# Patient Record
Sex: Male | Born: 1940 | Race: White | Hispanic: No
Health system: Southern US, Community
[De-identification: ages and names within clinical notes are randomized; demographics above are authoritative.]

## PROBLEM LIST (undated history)

## (undated) DIAGNOSIS — I10 Essential (primary) hypertension: Secondary | ICD-10-CM

## (undated) DIAGNOSIS — F03918 Unspecified dementia, unspecified severity, with other behavioral disturbance: Secondary | ICD-10-CM

## (undated) DIAGNOSIS — F0391 Unspecified dementia with behavioral disturbance: Secondary | ICD-10-CM

## (undated) DIAGNOSIS — I2510 Atherosclerotic heart disease of native coronary artery without angina pectoris: Secondary | ICD-10-CM

---

## 2020-11-21 HISTORY — PX: LAPAROSCOPIC INTERNAL HERNIA REPAIR: SHX6502

## 2021-01-15 ENCOUNTER — Other Ambulatory Visit: Payer: Self-pay

## 2021-01-15 ENCOUNTER — Emergency Department (HOSPITAL_COMMUNITY): Payer: Medicare Other

## 2021-01-15 ENCOUNTER — Emergency Department (HOSPITAL_COMMUNITY)
Admission: EM | Admit: 2021-01-15 | Discharge: 2021-01-15 | Disposition: A | Discharge status: Home / Self Care | Payer: Medicare Other | Attending: Emergency Medicine | Admitting: Emergency Medicine

## 2021-01-15 DIAGNOSIS — Z23 Encounter for immunization: Secondary | ICD-10-CM | POA: Insufficient documentation

## 2021-01-15 DIAGNOSIS — S0993XA Unspecified injury of face, initial encounter: Secondary | ICD-10-CM | POA: Diagnosis present

## 2021-01-15 DIAGNOSIS — F0390 Unspecified dementia without behavioral disturbance: Secondary | ICD-10-CM | POA: Diagnosis not present

## 2021-01-15 DIAGNOSIS — W19XXXA Unspecified fall, initial encounter: Secondary | ICD-10-CM

## 2021-01-15 DIAGNOSIS — W06XXXA Fall from bed, initial encounter: Secondary | ICD-10-CM | POA: Diagnosis not present

## 2021-01-15 DIAGNOSIS — S0181XA Laceration without foreign body of other part of head, initial encounter: Secondary | ICD-10-CM | POA: Insufficient documentation

## 2021-01-15 MED ORDER — HYDROGEN PEROXIDE 3 % EX SOLN
CUTANEOUS | Status: AC
  Filled 2021-01-15: qty 473

## 2021-01-15 MED ORDER — TETANUS-DIPHTH-ACELL PERTUSSIS 5-2.5-18.5 LF-MCG/0.5 IM SUSY
0.5000 mL | PREFILLED_SYRINGE | Freq: Once | INTRAMUSCULAR | Status: AC
  Administered 2021-01-15: 0.5 mL via INTRAMUSCULAR
  Filled 2021-01-15: qty 0.5

## 2021-01-15 NOTE — ED Notes (Signed)
[facility] has been contacted regarding patient transport.  

## 2021-01-15 NOTE — ED Triage Notes (Signed)
Patient BIB EMS from [facility] due to fall from standing position, patient did not lose consciousness and is not on blood thinners. Patient has laceration over right eye. Patient is AxOx1 at baseline. Patient has previous right rotator cuff injury to right shoulder that is unrelated to this fall.

## 2021-01-15 NOTE — Discharge Instructions (Addendum)
You were seen today after a fall.  Your CT scan is reassuring.  Your laceration was repaired with glue.  Allow this to fall off on its own.

## 2021-01-15 NOTE — ED Provider Notes (Signed)
[facility]-EMERGENCY DEPT Provider Note   CSN: [id number] Arrival date & time: 01/15/21  0410     History Chief Complaint  Patient presents with  . Fall    Nihaal Friesen is a 80 y.o. male.  HPI     This is a 80 year old male with reported history of dementia who presents following a fall.  Patient does not provide any history.  History provided by EMS.  History of dementia with oriented only to himself.  Was standing at the foot of his bed when he fell.  This was a witnessed fall.  He reportedly did not lose consciousness.  He is not on any blood thinners.  Level 5 caveat for dementia  No past medical history on file.  There are no problems to display for this patient. Unknown PMH  No family history on file.     Home Medications Prior to Admission medications   Not on File    Allergies    Patient has no allergy information on record.  Review of Systems   Review of Systems  Unable to perform ROS: Dementia    Physical Exam Updated Vital Signs BP (!) 151/88   Pulse (!) 51   Temp 98.2 F (36.8 C) (Oral)   Resp 15   SpO2 100%   Physical Exam Vitals and nursing note reviewed.  Constitutional:      Appearance: He is well-developed and well-nourished.     Comments: ABCs intact, elderly, chronically ill-appearing  HENT:     Head: Atraumatic.     Comments: 2 cm laceration right forehead, no active bleeding    Nose: Nose normal.     Mouth/Throat:     Mouth: Mucous membranes are dry.  Eyes:     Pupils: Pupils are equal, round, and reactive to light.  Cardiovascular:     Rate and Rhythm: Normal rate and regular rhythm.     Heart sounds: Normal heart sounds.  Pulmonary:     Effort: Pulmonary effort is normal. No respiratory distress.     Breath sounds: Normal breath sounds. No wheezing.  Abdominal:     General: Bowel sounds are normal.     Palpations: Abdomen is soft.     Tenderness: There is no abdominal tenderness. There is no  rebound.  Musculoskeletal:        General: No edema.     Cervical back: Neck supple.     Comments: Normal range of motion of the bilateral hips and knees, no obvious deformities, no pain noted with range of motion  Lymphadenopathy:     Cervical: No cervical adenopathy.  Skin:    General: Skin is warm and dry.  Neurological:     Mental Status: He is alert.     Comments: Only oriented to self, will not answer directed questions, appears to move all 4 extremities but does not follow commands consistently  Psychiatric:        Mood and Affect: Mood and affect normal.     Comments: Unable to assess     ED Results / Procedures / Treatments   Labs (all labs ordered are listed, but only abnormal results are displayed) Labs Reviewed - No data to display  EKG None  Radiology CT Head Wo Contrast  Result Date: 01/15/2021 CLINICAL DATA:  Fall from standing with eye laceration EXAM: CT HEAD WITHOUT CONTRAST CT CERVICAL SPINE WITHOUT CONTRAST TECHNIQUE: Multidetector CT imaging of the head and cervical spine was performed following the standard protocol  without intravenous contrast. Multiplanar CT image reconstructions of the cervical spine were also generated. COMPARISON:  None. FINDINGS: CT HEAD FINDINGS Brain: No evidence of swelling, acute infarction, hemorrhage, hydrocephalus, extra-axial collection or mass lesion/mass effect. Chronic small vessel ischemia which is confluent in the deep white matter. 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 Vascular: No hyperdense vessel or unexpected calcification. Skull: No acute finding. Benign sclerosis at the sphenoid body from fibro-osseous lesion or arrested sinus aeration. Sinuses/Orbits: No evidence of injury. Bilateral cataract resection. CT CERVICAL SPINE FINDINGS Alignment: Normal. Skull base and vertebrae: No acute fracture. No primary bone lesion or focal pathologic process. Soft tissues and spinal canal: No prevertebral fluid or  swelling. No visible canal hematoma. Disc levels: Multilevel degenerative disc narrowing and facet spurring. Upper chest: Negative IMPRESSION: 1. No evidence of acute intracranial or cervical spine injury. 2. Small remote right parietal infarct. Electronically Signed   By: Marnee Spring M.D.   On: 01/15/2021 07:21   CT Cervical Spine Wo Contrast  Result Date: 01/15/2021 CLINICAL DATA:  Fall from standing with eye laceration EXAM: CT HEAD WITHOUT CONTRAST CT CERVICAL SPINE WITHOUT CONTRAST TECHNIQUE: Multidetector CT imaging of the head and cervical spine was performed following the standard protocol without intravenous contrast. Multiplanar CT image reconstructions of the cervical spine were also generated. COMPARISON:  None. FINDINGS: CT HEAD FINDINGS Brain: No evidence of swelling, acute infarction, hemorrhage, hydrocephalus, extra-axial collection or mass lesion/mass effect. Chronic small vessel ischemia which is confluent in the deep white matter. 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 Vascular: No hyperdense vessel or unexpected calcification. Skull: No acute finding. Benign sclerosis at the sphenoid body from fibro-osseous lesion or arrested sinus aeration. Sinuses/Orbits: No evidence of injury. Bilateral cataract resection. CT CERVICAL SPINE FINDINGS Alignment: Normal. Skull base and vertebrae: No acute fracture. No primary bone lesion or focal pathologic process. Soft tissues and spinal canal: No prevertebral fluid or swelling. No visible canal hematoma. Disc levels: Multilevel degenerative disc narrowing and facet spurring. Upper chest: Negative IMPRESSION: 1. No evidence of acute intracranial or cervical spine injury. 2. Small remote right parietal infarct. Electronically Signed   By: Marnee Spring M.D.   On: 01/15/2021 07:21    Procedures .Marland KitchenLaceration Repair  Date/Time: 01/15/2021 7:32 AM Performed by: Shon Baton, MD Authorized by: Shon Baton, MD   Consent:     Consent obtained:  Emergent situation Anesthesia:    Anesthesia method:  None Laceration details:    Location:  Face   Face location:  R eyebrow   Length (cm):  3   Depth (mm):  5 Exploration:    Limited defect created (wound extended): no     Imaging obtained: x-ray     Imaging outcome: foreign body not noted     Wound exploration: wound explored through full range of motion     Contaminated: no   Treatment:    Area cleansed with:  Saline   Amount of cleaning:  Standard   Irrigation solution:  Sterile saline   Irrigation method:  Syringe   Visualized foreign bodies/material removed: no     Debridement:  None   Undermining:  None   Scar revision: no   Skin repair:    Repair method:  Tissue adhesive and Steri-Strips   Number of Steri-Strips:  2 Approximation:    Approximation:  Close Repair type:    Repair type:  Simple Post-procedure details:    Dressing:  Open (no dressing)   Procedure completion:  Tolerated     Medications Ordered in ED Medications  Tdap (BOOSTRIX) injection 0.5 mL (0.5 mLs Intramuscular Given 01/15/21 0501)  hydrogen peroxide 3 % external solution (  Given 01/15/21 0615)    ED Course  I have reviewed the triage vital signs and the nursing notes.  Pertinent labs & imaging results that were available during my care of the patient were reviewed by me and considered in my medical decision making (see chart for details).    MDM Rules/Calculators/A&P                          Patient presents following a fall.  It was witnessed and reported to be mechanical.  He appears to be at his baseline from a dementia standpoint.  He has a laceration to the right side of the forehead just at the eyebrow.  This was repaired with glue as patient was intermittently agitated and I did not feel like he would tolerate sutures.  Wound was approximated with Steri-Strips and glue was applied.  CT head and neck obtained given patient's age and does not show any evidence of  intracranial bleed or cervical fracture.  Patient will be discharged back to his facility.  No other obvious injuries.  After history, exam, and medical workup I feel the patient has been appropriately medically screened and is safe for discharge home. Pertinent diagnoses were discussed with the patient. Patient was given return precautions.  Final Clinical Impression(s) / ED Diagnoses Final diagnoses:  Fall, initial encounter  Facial laceration, initial encounter    Rx / DC Orders ED Discharge Orders    None       Horton, Mayer Masker, MD 01/15/21 [id number]

## 2021-02-02 ENCOUNTER — Emergency Department (HOSPITAL_COMMUNITY): Payer: Medicare Other

## 2021-02-02 ENCOUNTER — Inpatient Hospital Stay (HOSPITAL_COMMUNITY)
Admission: EM | Admit: 2021-02-02 | Discharge: 2021-02-12 | DRG: 640 | Disposition: E | Payer: Medicare Other | Source: Skilled Nursing Facility | Attending: Internal Medicine | Admitting: Internal Medicine

## 2021-02-02 ENCOUNTER — Encounter (HOSPITAL_COMMUNITY): Payer: Self-pay | Admitting: Internal Medicine

## 2021-02-02 ENCOUNTER — Other Ambulatory Visit: Payer: Self-pay

## 2021-02-02 DIAGNOSIS — Z20822 Contact with and (suspected) exposure to covid-19: Secondary | ICD-10-CM | POA: Diagnosis present

## 2021-02-02 DIAGNOSIS — E870 Hyperosmolality and hypernatremia: Secondary | ICD-10-CM | POA: Diagnosis not present

## 2021-02-02 DIAGNOSIS — Z9181 History of falling: Secondary | ICD-10-CM

## 2021-02-02 DIAGNOSIS — E872 Acidosis: Secondary | ICD-10-CM | POA: Diagnosis present

## 2021-02-02 DIAGNOSIS — F0391 Unspecified dementia with behavioral disturbance: Secondary | ICD-10-CM | POA: Diagnosis present

## 2021-02-02 DIAGNOSIS — Z515 Encounter for palliative care: Secondary | ICD-10-CM

## 2021-02-02 DIAGNOSIS — F03918 Unspecified dementia, unspecified severity, with other behavioral disturbance: Secondary | ICD-10-CM | POA: Diagnosis present

## 2021-02-02 DIAGNOSIS — Z66 Do not resuscitate: Secondary | ICD-10-CM | POA: Diagnosis present

## 2021-02-02 DIAGNOSIS — F0281 Dementia in other diseases classified elsewhere with behavioral disturbance: Secondary | ICD-10-CM | POA: Diagnosis present

## 2021-02-02 DIAGNOSIS — I10 Essential (primary) hypertension: Secondary | ICD-10-CM | POA: Diagnosis present

## 2021-02-02 DIAGNOSIS — R54 Age-related physical debility: Secondary | ICD-10-CM | POA: Diagnosis present

## 2021-02-02 DIAGNOSIS — Z8052 Family history of malignant neoplasm of bladder: Secondary | ICD-10-CM

## 2021-02-02 DIAGNOSIS — G9341 Metabolic encephalopathy: Secondary | ICD-10-CM | POA: Diagnosis present

## 2021-02-02 DIAGNOSIS — W19XXXA Unspecified fall, initial encounter: Secondary | ICD-10-CM

## 2021-02-02 DIAGNOSIS — G309 Alzheimer's disease, unspecified: Secondary | ICD-10-CM | POA: Diagnosis present

## 2021-02-02 DIAGNOSIS — R532 Functional quadriplegia: Secondary | ICD-10-CM | POA: Diagnosis present

## 2021-02-02 DIAGNOSIS — Z8619 Personal history of other infectious and parasitic diseases: Secondary | ICD-10-CM

## 2021-02-02 DIAGNOSIS — I2510 Atherosclerotic heart disease of native coronary artery without angina pectoris: Secondary | ICD-10-CM | POA: Diagnosis present

## 2021-02-02 DIAGNOSIS — E860 Dehydration: Secondary | ICD-10-CM | POA: Diagnosis present

## 2021-02-02 DIAGNOSIS — Z79899 Other long term (current) drug therapy: Secondary | ICD-10-CM

## 2021-02-02 HISTORY — DX: Unspecified dementia, unspecified severity, with other behavioral disturbance: F03.918

## 2021-02-02 HISTORY — DX: Essential (primary) hypertension: I10

## 2021-02-02 HISTORY — DX: Unspecified dementia with behavioral disturbance: F03.91

## 2021-02-02 HISTORY — DX: Atherosclerotic heart disease of native coronary artery without angina pectoris: I25.10

## 2021-02-02 LAB — COMPREHENSIVE METABOLIC PANEL
ALT: 20 U/L (ref 0–44)
AST: 29 U/L (ref 15–41)
Albumin: 3.7 g/dL (ref 3.5–5.0)
Alkaline Phosphatase: 124 U/L (ref 38–126)
Anion gap: 9 (ref 5–15)
BUN: 47 mg/dL — ABNORMAL HIGH (ref 8–23)
CO2: 30 mmol/L (ref 22–32)
Calcium: 10.6 mg/dL — ABNORMAL HIGH (ref 8.9–10.3)
Chloride: 126 mmol/L — ABNORMAL HIGH (ref 98–111)
Creatinine, Ser: 1.23 mg/dL (ref 0.61–1.24)
GFR, Estimated: >60 mL/min (ref >60)
Glucose, Bld: 118 mg/dL — ABNORMAL HIGH (ref 70–99)
Potassium: 3.8 mmol/L (ref 3.5–5.1)
Sodium: 165 mmol/L (ref 135–145)
Total Bilirubin: 1.4 mg/dL — ABNORMAL HIGH (ref 0.3–1.2)
Total Protein: 6.9 g/dL (ref 6.5–8.1)

## 2021-02-02 LAB — URINALYSIS, ROUTINE W REFLEX MICROSCOPIC
Bilirubin Urine: NEGATIVE
Glucose, UA: NEGATIVE mg/dL
Ketones, ur: NEGATIVE mg/dL
Nitrite: NEGATIVE
Protein, ur: 100 mg/dL — AB
RBC / HPF: >50 RBC/hpf — ABNORMAL HIGH (ref 0–5)
Specific Gravity, Urine: 1.021 (ref 1.005–1.030)
WBC, UA: >50 WBC/hpf — ABNORMAL HIGH (ref 0–5)
pH: 5 (ref 5.0–8.0)

## 2021-02-02 LAB — CBC WITH DIFFERENTIAL/PLATELET
Abs Immature Granulocytes: 0 10*3/uL (ref 0.00–0.07)
Basophils Absolute: 0 10*3/uL (ref 0.0–0.1)
Basophils Relative: 1 %
Eosinophils Absolute: 0.1 10*3/uL (ref 0.0–0.5)
Eosinophils Relative: 1 %
HCT: 39.8 % (ref 39.0–52.0)
Hemoglobin: 12.2 g/dL — ABNORMAL LOW (ref 13.0–17.0)
Immature Granulocytes: 0 %
Lymphocytes Relative: 41 %
Lymphs Abs: 2.2 10*3/uL (ref 0.7–4.0)
MCH: 32.5 pg (ref 26.0–34.0)
MCHC: 30.7 g/dL (ref 30.0–36.0)
MCV: 106.1 fL — ABNORMAL HIGH (ref 80.0–100.0)
Monocytes Absolute: 0.5 10*3/uL (ref 0.1–1.0)
Monocytes Relative: 10 %
Neutro Abs: 2.5 10*3/uL (ref 1.7–7.7)
Neutrophils Relative %: 47 %
Platelets: 211 10*3/uL (ref 150–400)
RBC: 3.75 MIL/uL — ABNORMAL LOW (ref 4.22–5.81)
RDW: 16.5 % — ABNORMAL HIGH (ref 11.5–15.5)
WBC: 5.4 10*3/uL (ref 4.0–10.5)
nRBC: 0 % (ref 0.0–0.2)

## 2021-02-02 LAB — LACTIC ACID, PLASMA: Lactic Acid, Venous: 2.9 mmol/L (ref 0.5–1.9)

## 2021-02-02 LAB — PROTIME-INR
INR: 1.3 — ABNORMAL HIGH (ref 0.8–1.2)
Prothrombin Time: 15.4 seconds — ABNORMAL HIGH (ref 11.4–15.2)

## 2021-02-02 LAB — APTT: aPTT: 36 seconds (ref 24–36)

## 2021-02-02 LAB — VALPROIC ACID LEVEL: Valproic Acid Lvl: <10 ug/mL — ABNORMAL LOW (ref 50.0–100.0)

## 2021-02-02 MED ORDER — GLYCOPYRROLATE 1 MG PO TABS
1.0000 mg | ORAL_TABLET | ORAL | Status: DC | PRN
  Filled 2021-02-02: qty 1

## 2021-02-02 MED ORDER — ACETAMINOPHEN 650 MG RE SUPP: 650.0000 mg | Freq: Four times a day (QID) | RECTAL | Status: DC | PRN

## 2021-02-02 MED ORDER — ONDANSETRON 4 MG PO TBDP: 4.0000 mg | ORAL_TABLET | Freq: Four times a day (QID) | ORAL | Status: DC | PRN

## 2021-02-02 MED ORDER — HALOPERIDOL LACTATE 5 MG/ML IJ SOLN
2.0000 mg | Freq: Once | INTRAMUSCULAR | Status: AC
  Administered 2021-02-02: 2 mg via INTRAMUSCULAR
  Filled 2021-02-02: qty 1

## 2021-02-02 MED ORDER — LORAZEPAM 2 MG/ML PO CONC: 1.0000 mg | ORAL | Status: DC | PRN

## 2021-02-02 MED ORDER — ACETAMINOPHEN 325 MG PO TABS: 650.0000 mg | ORAL_TABLET | Freq: Four times a day (QID) | ORAL | Status: DC | PRN

## 2021-02-02 MED ORDER — GLYCOPYRROLATE 0.2 MG/ML IJ SOLN
0.2000 mg | INTRAMUSCULAR | Status: DC | PRN
  Administered 2021-02-04 – 2021-02-05 (×4): 0.2 mg via INTRAVENOUS
  Filled 2021-02-02 (×2): qty 1

## 2021-02-02 MED ORDER — LORAZEPAM 2 MG/ML IJ SOLN
1.0000 mg | INTRAMUSCULAR | Status: DC | PRN
  Administered 2021-02-03 – 2021-02-05 (×8): 1 mg via INTRAVENOUS
  Filled 2021-02-02 (×8): qty 1

## 2021-02-02 MED ORDER — HYDROMORPHONE HCL 1 MG/ML IJ SOLN
0.5000 mg | INTRAMUSCULAR | Status: DC | PRN
  Administered 2021-02-03 – 2021-02-04 (×5): 0.5 mg via INTRAVENOUS
  Filled 2021-02-02: qty 0.5
  Filled 2021-02-02: qty 1
  Filled 2021-02-02 (×3): qty 0.5

## 2021-02-02 MED ORDER — HALOPERIDOL LACTATE 5 MG/ML IJ SOLN: 0.5000 mg | INTRAMUSCULAR | Status: DC | PRN

## 2021-02-02 MED ORDER — LORAZEPAM 1 MG PO TABS: 1.0000 mg | ORAL_TABLET | ORAL | Status: DC | PRN

## 2021-02-02 MED ORDER — ONDANSETRON HCL 4 MG/2ML IJ SOLN: 4.0000 mg | Freq: Four times a day (QID) | INTRAMUSCULAR | Status: DC | PRN

## 2021-02-02 MED ORDER — MORPHINE SULFATE (CONCENTRATE) 10 MG/0.5ML PO SOLN: 5.0000 mg | ORAL | Status: DC | PRN

## 2021-02-02 MED ORDER — HALOPERIDOL 0.5 MG PO TABS: 0.5000 mg | ORAL_TABLET | ORAL | Status: DC | PRN

## 2021-02-02 MED ORDER — GLYCOPYRROLATE 0.2 MG/ML IJ SOLN
0.2000 mg | INTRAMUSCULAR | Status: DC | PRN
  Filled 2021-02-02 (×2): qty 1

## 2021-02-02 MED ORDER — POLYVINYL ALCOHOL 1.4 % OP SOLN
1.0000 [drp] | Freq: Four times a day (QID) | OPHTHALMIC | Status: DC | PRN
  Filled 2021-02-02: qty 15

## 2021-02-02 MED ORDER — BIOTENE DRY MOUTH MT LIQD: 15.0000 mL | OROMUCOSAL | Status: DC | PRN

## 2021-02-02 MED ORDER — HALOPERIDOL LACTATE 2 MG/ML PO CONC
0.5000 mg | ORAL | Status: DC | PRN
  Filled 2021-02-02: qty 0.3

## 2021-02-02 MED ORDER — SODIUM CHLORIDE 0.9 % IV BOLUS (SEPSIS)
500.0000 mL | Freq: Once | INTRAVENOUS | Status: AC
  Administered 2021-02-02: 500 mL via INTRAVENOUS

## 2021-02-02 MED ORDER — SODIUM CHLORIDE 0.9 % IV SOLN
1.0000 g | Freq: Once | INTRAVENOUS | Status: AC
  Administered 2021-02-02: 1 g via INTRAVENOUS
  Filled 2021-02-02: qty 10

## 2021-02-02 NOTE — ED Provider Notes (Signed)
[facility]-EMERGENCY DEPT Provider Note   CSN: [id number] Arrival date & time: 13-Feb-2021  1324     History Chief Complaint  Patient presents with  . Fall    Johnathan Combs is a 80 y.o. male.  The history is provided by the EMS personnel and medical records.  Fall   Johnathan Combs is a 80 y.o. male who presents to the Emergency Department complaining of fall.  Level V caveat due to dementia. History is provided by EMS. He is a resident at [facility] and nonverbal and nonambulatory at baseline per report. He was found down in a hallway.   Additional history available from the patient's son-in-law after patient's initial assessment. Son-in-law states that he was admitted to do probably for inguinal hernia surgery several months ago at [facility]. He was able to walk in prior to that surgery. Following the surgery he had difficulty awaking from anesthesia and has suffered from progressive dementia since then.    Past Medical History:  Diagnosis Date  . CAD (coronary artery disease)   . Dementia with aggressive behavior (HCC)   . Hypertension    alzheimers  Patient Active Problem List   Diagnosis Date Noted  . Hypernatremia 02-13-2021  . Dehydration 2021/02/13  . Hypercalcemia 02/13/2021  . Hypertension   . Dementia with aggressive behavior (HCC)   . CAD (coronary artery disease)          Family History  Problem Relation Age of Onset  . Bladder Cancer Father        Home Medications Prior to Admission medications   Medication Sig Start Date End Date Taking? Authorizing Provider  acetaminophen (TYLENOL) 325 MG tablet Take 650 mg by mouth every 6 (six) hours as needed for mild pain.   Yes [provider]  amLODipine (NORVASC) 5 MG tablet Take 5 mg by mouth daily.   Yes [provider]  carboxymethylcellulose (REFRESH PLUS) 0.5 % SOLN Place 1 drop into both eyes 3 (three) times daily as needed (dry eyes).    Yes [provider]  cyanocobalamin 1000 MCG tablet Take 1,000 mcg by mouth daily.   Yes [provider]  Lidocaine 4 % PTCH Apply 1 patch topically daily at 6 (six) AM. To shoulder   Yes [provider]  melatonin 3 MG TABS tablet Take 3 mg by mouth at bedtime.   Yes [provider]  melatonin 5 MG TABS Take 5 mg by mouth daily. 8 am   Yes [provider]  metoprolol succinate (TOPROL-XL) 25 MG 24 hr tablet Take 25 mg by mouth daily.   Yes [provider]  mupirocin ointment (BACTROBAN) 2 % Apply 1 application topically 2 (two) times daily. Apply to left inguinal incision   Yes [provider]  niacin 500 MG tablet Take 500 mg by mouth at bedtime.   Yes [provider]  Nutritional Supplements (NUTRITIONAL SHAKE HIGH PROTEIN) LIQD Take 1 Container by mouth in the morning and at bedtime.   Yes [provider]  pantoprazole (PROTONIX) 40 MG tablet Take 40 mg by mouth daily.   Yes [provider]  polyethylene glycol (MIRALAX / GLYCOLAX) 17 g packet Take 17 g by mouth 2 (two) times daily.   Yes [provider]  potassium chloride SA (KLOR-CON) 20 MEQ tablet Take 20 mEq by mouth daily.   Yes [provider]  prednisoLONE acetate (PRED FORTE) 1 % ophthalmic suspension Place 1 drop into the  right eye daily.   Yes [provider]  sucralfate (CARAFATE) 1 g tablet Take 1 g by mouth 2 (two) times daily.   Yes [provider]  traMADol (ULTRAM) 50 MG tablet Take 50 mg by mouth every 8 (eight) hours as needed for moderate pain.   Yes [provider]  valproic acid (DEPAKENE) 250 MG/5ML solution Take 250 mg by mouth 2 (two) times daily.   Yes [provider]  Vitamin E 450 MG (1000 UT) CAPS Take 450 mg by mouth daily.   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  Constitutional:      Appearance: He is well-developed and well-nourished.  HENT:     Head: Normocephalic and atraumatic.     Comments: Dry mucous membranes Cardiovascular:     Rate and Rhythm: Normal rate and regular rhythm.     Heart sounds: No murmur heard.   Pulmonary:     Effort: Pulmonary effort is normal. No respiratory distress.     Breath sounds: Normal breath sounds.  Abdominal:     Palpations: Abdomen is soft.     Tenderness: There is no abdominal tenderness. There is no guarding or rebound.     Comments: There is a left inguinal surgical wound that is healing. The lateral portion of this wound is draining yellow material. There is no appreciable tenderness to palpation in this region. There is no local erythema or palpable fluid collections. No scrotal edema or tenderness  Musculoskeletal:        General: No tenderness or edema.     Comments: 2+ DP pulses bilaterally. On palpation and range of motion of bilateral hips there is no clear tenderness or grimace on exam. There is pain with passive range of motion to the right shoulder, right elbow. No palpable deformity.  Skin:    General: Skin is warm and dry.  Neurological:     Mental Status: He is alert.     Comments: Moves all extremities spontaneously, symmetrically but very weakly. Does not follow commands. No significant intelligible speech.  Psychiatric:        Mood and Affect: Mood and affect normal.     Comments: Unable to assess     ED Results / Procedures / Treatments   Labs (all labs ordered are listed, but only abnormal results are displayed) Labs Reviewed  LACTIC ACID, PLASMA - Abnormal; Notable for the following components:      Result Value   Lactic Acid, Venous 2.9 (*)    All other  components within normal limits  COMPREHENSIVE METABOLIC PANEL - Abnormal; Notable for the following components:   Sodium 165 (*)    Chloride 126 (*)    Glucose, Bld 118 (*)    BUN 47 (*)    Calcium 10.6 (*)    Total Bilirubin 1.4 (*)    All other components within normal limits  CBC WITH DIFFERENTIAL/PLATELET - Abnormal; Notable for the following components:   RBC 3.75 (*)    Hemoglobin 12.2 (*)    MCV 106.1 (*)    RDW 16.5 (*)  All other components within normal limits  PROTIME-INR - Abnormal; Notable for the following components:   Prothrombin Time 15.4 (*)    INR 1.3 (*)    All other components within normal limits  URINALYSIS, ROUTINE W REFLEX MICROSCOPIC - Abnormal; Notable for the following components:   APPearance TURBID (*)    Hgb urine dipstick SMALL (*)    Protein, ur 100 (*)    Leukocytes,Ua MODERATE (*)    RBC / HPF >50 (*)    WBC, UA >50 (*)    Bacteria, UA RARE (*)    Non Squamous Epithelial 0-5 (*)    All other components within normal limits  VALPROIC ACID LEVEL - Abnormal; Notable for the following components:   Valproic Acid Lvl <10 (*)    All other components within normal limits  CULTURE, BLOOD (SINGLE)  URINE CULTURE  SARS CORONAVIRUS 2 (TAT 6-24 HRS)  APTT    EKG EKG Interpretation  Date/Time:  Saturday February 02 2021 14:01:26 EST Ventricular Rate:  64 PR Interval:  232 QRS Duration: 80 QT Interval:  418 QTC Calculation: 431 R Axis:     Text Interpretation: Sinus rhythm with 1st degree A-V block Nonspecific T wave abnormality Abnormal ECG No previous tracing Confirmed by Tilden Fossa [phone]) on 01/16/2021 2:10:13 PM   Radiology DG Chest 1 View  Result Date: 01/24/2021 CLINICAL DATA:  Pain status post fall EXAM: CHEST  1 VIEW COMPARISON:  None. 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 The lung volumes are low. There is no pneumothorax. No definite acute displaced fracture. There are end-stage  degenerative changes of the right glenohumeral joint. Atelectasis is noted at the lung bases. Aortic calcifications are noted. The heart size is unremarkable. IMPRESSION: 1. No acute cardiopulmonary process. 2. 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 3. End-stage degenerative changes of the right glenohumeral joint. Electronically Signed   By: Katherine Mantle M.D.   On: 01/26/2021 15:17   DG Pelvis 1-2 Views  Result Date: 01/18/2021 CLINICAL DATA:  Pain status post fall EXAM: PELVIS - 1-2 VIEW COMPARISON:  None. FINDINGS: There is osteopenia which limits detection of nondisplaced fractures. There is no definite acute displaced fracture or dislocation. Evaluation of the parasymphyseal pubic bones is limited by overlapping dense stool within the rectum. There is a large amount of stool in the rectum. There are calcifications projecting over the lower left mid abdomen. Degenerative changes are noted of the bilateral hips and lower lumbar spine. IMPRESSION: 1. No definite acute displaced fracture or dislocation. Evaluation of the parasymphyseal pubic bones is limited by overlapping dense stool within the rectum. 2. Large amount of stool in the rectum. 3. Degenerative changes of the bilateral hips and lower lumbar spine. 4. Calcifications project over the left lower abdomen. These are of unknown clinical significance and may represent nephroliths. Electronically Signed   By: Katherine Mantle M.D.   On: 01/15/2021 15:17   DG Shoulder Right  Result Date: 01/23/2021 CLINICAL DATA:  Pain EXAM: RIGHT SHOULDER - 2+ VIEW COMPARISON:  None. FINDINGS: There are end-stage degenerative changes of the right glenohumeral joint. There is no evidence for an acute displaced fracture or dislocation. There is osteopenia which limits detection of nondisplaced fractures. IMPRESSION: 1. No acute displaced fracture or dislocation. 2. End-stage degenerative changes of the right  glenohumeral joint. Electronically Signed   By: Katherine Mantle M.D.   On: 01/26/2021 15:20   DG Elbow Complete  Right  Result Date: February 04, 2021 CLINICAL DATA:  Pain status post fall EXAM: RIGHT ELBOW - COMPLETE 3+ VIEW COMPARISON:  None. FINDINGS: There is no evidence of fracture, dislocation, or joint effusion. There is no evidence of arthropathy or other focal bone abnormality. Soft tissues are unremarkable. IMPRESSION: Negative. Electronically Signed   By: Katherine Mantle M.D.   On: February 04, 2021 15:18   CT Head Wo Contrast  Result Date: February 04, 2021 CLINICAL DATA:  Found on floor at nursing home, neck trauma, head trauma EXAM: CT HEAD WITHOUT CONTRAST CT CERVICAL SPINE WITHOUT CONTRAST TECHNIQUE: Multidetector CT imaging of the head and cervical spine was performed following the standard protocol without intravenous contrast. Multiplanar CT image reconstructions of the cervical spine were also generated. COMPARISON:  Two in 22 FINDINGS: CT HEAD FINDINGS Brain: Generalized atrophy. Normal ventricular morphology. No midline shift or mass effect. Small vessel chronic ischemic changes of deep cerebral white matter. No intracranial hemorrhage, mass lesion, evidence of acute infarction, or extra-axial fluid collection. Vascular: No hyperdense vessels. Atherosclerotic calcification at vertebral arteries. Skull: Demineralized but intact Sinuses/Orbits: Clear Other: N/A CT CERVICAL SPINE FINDINGS Alignment: Minimal anterolisthesis C4-C5. Remaining alignments normal. Skull base and vertebrae: Multilevel facet degenerative changes. Multilevel disc space narrowing with few scattered endplate spurs. Vertebral body heights maintained. Skull base intact. No fracture, subluxation, or bone destruction. Soft tissues and spinal canal: Prevertebral soft tissues normal thickness. Significant atherosclerotic calcification in the carotid systems. Disc levels:  No specific abnormality Upper chest: Lung apices clear Other: N/A  IMPRESSION: Atrophy with small vessel chronic ischemic changes of deep cerebral white matter. No acute intracranial abnormalities. 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 No acute cervical spine abnormalities. Electronically Signed   By: Ulyses Southward M.D.   On: 2021/02/04 15:35   CT Cervical Spine Wo Contrast  Result Date: Feb 04, 2021 CLINICAL DATA:  Found on floor at nursing home, neck trauma, head trauma EXAM: CT HEAD WITHOUT CONTRAST CT CERVICAL SPINE WITHOUT CONTRAST TECHNIQUE: Multidetector CT imaging of the head and cervical spine was performed following the standard protocol without intravenous contrast. Multiplanar CT image reconstructions of the cervical spine were also generated. COMPARISON:  Two in 22 FINDINGS: CT HEAD FINDINGS Brain: Generalized atrophy. Normal ventricular morphology. No midline shift or mass effect. Small vessel chronic ischemic changes of deep cerebral white matter. No intracranial hemorrhage, mass lesion, evidence of acute infarction, or extra-axial fluid collection. Vascular: No hyperdense vessels. Atherosclerotic calcification at vertebral arteries. Skull: Demineralized but intact Sinuses/Orbits: Clear Other: N/A CT CERVICAL SPINE FINDINGS Alignment: Minimal anterolisthesis C4-C5. Remaining alignments normal. Skull base and vertebrae: Multilevel facet degenerative changes. Multilevel disc space narrowing with few scattered endplate spurs. Vertebral body heights maintained. Skull base intact. No fracture, subluxation, or bone destruction. Soft tissues and spinal canal: Prevertebral soft tissues normal thickness. Significant atherosclerotic calcification in the carotid systems. Disc levels:  No specific abnormality Upper chest: Lung apices clear Other: N/A IMPRESSION: Atrophy with small vessel chronic ischemic changes of deep cerebral white matter. No acute intracranial abnormalities. Multilevel degenerative disc and facet disease changes of the  cervical spine. No acute cervical spine abnormalities. Electronically Signed   By: Ulyses Southward M.D.   On: 04-Feb-2021 15:35    Procedures Procedures   Medications Ordered in ED Medications  acetaminophen (TYLENOL) tablet 650 mg (has no administration in time range)    Or  acetaminophen (TYLENOL) suppository 650 mg (has no administration in time range)  haloperidol (HALDOL) tablet 0.5 mg (has no administration  in time range)    Or  haloperidol (HALDOL) 2 MG/ML solution 0.5 mg (has no administration in time range)    Or  haloperidol lactate (HALDOL) injection 0.5 mg (has no administration in time range)  ondansetron (ZOFRAN-ODT) disintegrating tablet 4 mg (has no administration in time range)    Or  ondansetron (ZOFRAN) injection 4 mg (has no administration in time range)  glycopyrrolate (ROBINUL) tablet 1 mg (has no administration in time range)    Or  glycopyrrolate (ROBINUL) injection 0.2 mg (has no administration in time range)    Or  glycopyrrolate (ROBINUL) injection 0.2 mg (has no administration in time range)  antiseptic oral rinse (BIOTENE) solution 15 mL (has no administration in time range)  polyvinyl alcohol (LIQUIFILM TEARS) 1.4 % ophthalmic solution 1 drop (has no administration in time range)  morphine CONCENTRATE 10 MG/0.5ML oral solution 5 mg (has no administration in time range)    Or  morphine CONCENTRATE 10 MG/0.5ML oral solution 5 mg (has no administration in time range)  HYDROmorphone (DILAUDID) injection 0.5 mg (has no administration in time range)  LORazepam (ATIVAN) tablet 1 mg (has no administration in time range)    Or  LORazepam (ATIVAN) 2 MG/ML concentrated solution 1 mg (has no administration in time range)    Or  LORazepam (ATIVAN) injection 1 mg (has no administration in time range)  sodium chloride 0.9 % bolus 500 mL (0 mLs Intravenous Stopped 01/19/2021 1718)  haloperidol lactate (HALDOL) injection 2 mg (2 mg Intramuscular Given 01/21/2021 1604)   cefTRIAXone (ROCEPHIN) 1 g in sodium chloride 0.9 % 100 mL IVPB (0 g Intravenous Stopped 02/06/2021 2050)  haloperidol lactate (HALDOL) injection 2 mg (2 mg Intramuscular Given 01/29/2021 2021)    ED Course  I have reviewed the triage vital signs and the nursing notes.  Pertinent labs & imaging results that were available during my care of the patient were reviewed by me and considered in my medical decision making (see chart for details).    MDM Rules/Calculators/A&P                         Patient from [place] following an unwitnessed fall. Attempted to contact the facility, no nursing staff available for discussion about prior events. On evaluation patient is very dehydrated appearing. Sodium is significantly high at 165. No prior reports available for comparison. Discussed with patient son-in-law. Son states that he would not want aggressive measures and would want comfort measures if this is due to his dementia. Hospitalist consulted for admission for ongoing symptom management.  Final Clinical Impression(s) / ED Diagnoses Final diagnoses:  Hypernatremia  Fall, initial encounter    Rx / DC Orders ED Discharge Orders    None       Tilden Fossa, MD 01/17/2021 2316

## 2021-02-02 NOTE — ED Notes (Signed)
Small healing incision noted to right groining small amount of serous drainage noted.  Gauze dressing applied

## 2021-02-02 NOTE — ED Triage Notes (Signed)
Patient found down on floor at nursing facility [facility]). Patient is nonverbal  And non ambulatory brought in EMS.

## 2021-02-02 NOTE — ED Notes (Signed)
Condom cath has been placed. 

## 2021-02-02 NOTE — ED Notes (Signed)
Critical lab- Lactic 2.9

## 2021-02-02 NOTE — H&P (Signed)
History and Physical    Srinivas Lippman EML:[id number] DOB: Nov 09, 1941 DOA: 01/26/2021  PCP: Pcp, No  Patient coming from: [facility]  I have personally briefly reviewed patient's old medical records in [place]  Chief Complaint: Found down on floor  HPI: Johnathan Combs is a 80 y.o. 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  Patient is unable to provide any history due to dementia and history is otherwise obtained from EDP, chart review, and son by phone.  Patient had a recent prolonged hospitalization at [facility] from 11/21/2020-01/02/2021.  Patient was initially hospitalized for incarcerated inguinal hernia and underwent laparoscopic repair on 11/21/2020.  Hospital course was complicated by sepsis due to MSSA bacteremia.  There was concern for endocarditis on TTE.  TEE was not pursued per family wishes.  Patient was initially treated with cefazolin followed by 14 days oxacillin.  During hospitalization he was also noted to have a GI bleed.  Further evaluation with upper and lower endoscopy was recommended however family did not want to pursue those procedures as they did not want any procedures under anesthesia.  Patient's aspirin was discontinued.    Patient had a significant functional decline during hospital stay resulting in and is now largely nonverbal and nonambulatory status.  Patient was ultimately discharged to SNF.  Per son, patient has been eating fairly well when provided food however over the last week it seems that he has not been eating at all.  His nursing facility have been trying to encourage increasing liquid intake however he still has not been eating.  He is not able to verbalize any of his needs which has been ongoing since his recent hospitalization.  He is apparently found down at his  facility after a suspected unwitnessed fall and he was brought to the ED for further evaluation.  I did have a long discussion with his son by phone.  He is clear that the focus should remain on patient's comfort as it appears he may be approaching end-of-life.  CODE STATUS is DNR.  ED Course:  Initial vitals showed BP 141/89, pulse fifty-six, RR fourteen, temp 96.4 F, SPO2 98% on room air.  Labs significant for sodium 165, potassium 3.8, chloride 126, bicarb thirty, BUN forty-seven, creatinine 1.23, serum glucose 118, calcium 10.6, albumin 3.7, AST twenty-nine, ALT twenty, alk phos 124, total bilirubin 1.4, WBC 5.4, hemoglobin 12.2, platelets 211,000, lactic acid 2.9, INR 1.3.  Valproic acid level <10.  Urinalysis shows negative nitrites, moderate leukocytes, >50 WBCs and RBCs/hpf, rare bacteria on microscopy.  Urine culture was obtained and pending.  SARS-CoV-2 PCR is ordered and pending.  Pelvic x-ray does not show definite acute displaced fracture or dislocation.  Evaluation of the pubic bones is limited by overlapping dense stool within the rectum.  Large amount of stool in the rectum noted.  Degenerative changes of the bilateral hips and lower lumbar spine seen.  Portable chest x-ray negative for acute cardiopulmonary process.  Right shoulder x-ray negative for acute displaced fracture or dislocation.  End-stage degenerative change of the right glenohumeral joint noted.  Right elbow x-rays negative for evidence of fracture, dislocation.  CT head without contrast shows atrophy with small vessel chronic ischemic change in the deep cerebral white matter without acute intracranial normalities.  CT cervical spine without contrast is negative for acute cervical spine abnormalities.  Multilevel  degenerative disc and facet disease noted.  Patient was given 500 cc normal saline, IV ceftriaxone, IM Haldol 2 mg x 2.  EDP discussed with patient's family who largely want a focus on comfort  measures.  The hospitalist service was consulted to admit for further evaluation and management.  Review of Systems:  Unable to obtain full review of systems due to patient's dementia.   Past Medical History:  Diagnosis Date  . CAD (coronary artery disease)   . Dementia with aggressive behavior ([place])   . Hypertension     Past Surgical History:  Procedure Laterality Date  . LAPAROSCOPIC INTERNAL HERNIA REPAIR  11/21/2020    Social History:  has no history on file for tobacco use, alcohol use, and drug use.  Allergies  Allergen Reactions  . Lidocaine Other (See Comments)    unknown  . Sweet Potato Other (See Comments)    unknown  . Wild Yam [Dioscorea Villosa] Other (See Comments)    unknown    Family History  Problem Relation Age of Onset  . Bladder Cancer Father      Prior to Admission medications   Medication Sig Start Date End Date Taking? Authorizing Provider  acetaminophen (TYLENOL) 325 MG tablet Take 650 mg by mouth every 6 (six) hours as needed for mild pain.   Yes [provider]  amLODipine (NORVASC) 5 MG tablet Take 5 mg by mouth daily.   Yes [provider]  carboxymethylcellulose (REFRESH PLUS) 0.5 % SOLN Place 1 drop into both eyes 3 (three) times daily as needed (dry eyes).   Yes [provider]  cyanocobalamin 1000 MCG tablet Take 1,000 mcg by mouth daily.   Yes [provider]  Lidocaine 4 % PTCH Apply 1 patch topically daily at 6 (six) AM. To shoulder   Yes [provider]  melatonin 3 MG TABS tablet Take 3 mg by mouth at bedtime.   Yes [provider]  melatonin 5 MG TABS Take 5 mg by mouth daily. 8 am   Yes [provider]  metoprolol succinate (TOPROL-XL) 25 MG 24 hr tablet Take 25 mg by mouth daily.   Yes [provider]  mupirocin ointment (BACTROBAN) 2 % Apply 1 application topically 2 (two) times daily. Apply to left inguinal incision   Yes [provider]  niacin  500 MG tablet Take 500 mg by mouth at bedtime.   Yes [provider]  Nutritional Supplements (NUTRITIONAL SHAKE HIGH PROTEIN) LIQD Take 1 Container by mouth in the morning and at bedtime.   Yes [provider]  pantoprazole (PROTONIX) 40 MG tablet Take 40 mg by mouth daily.   Yes [provider]  polyethylene glycol (MIRALAX / GLYCOLAX) 17 g packet Take 17 g by mouth 2 (two) times daily.   Yes [provider]  potassium chloride SA (KLOR-CON) 20 MEQ tablet Take 20 mEq by mouth daily.   Yes [provider]  prednisoLONE acetate (PRED FORTE) 1 % ophthalmic suspension Place 1 drop into the right eye daily.   Yes [provider]  sucralfate (CARAFATE) 1 g tablet Take 1 g by mouth 2 (two) times daily.   Yes [provider]  traMADol (ULTRAM) 50 MG tablet Take 50 mg by mouth every 8 (eight) hours as needed for moderate pain.   Yes [provider]  valproic acid (DEPAKENE) 250 MG/5ML solution Take 250 mg by mouth 2 (two) times daily.   Yes [provider]  Vitamin E  450 MG (1000 UT) CAPS Take 450 mg by mouth daily.   Yes [provider]    Physical Exam: Vitals:   02/08/2021 2018 02/11/2021 2023 01/24/2021 2030 01/25/2021 2115  BP: 124/76  134/64 (!) 135/117  Pulse: 70 69 67 75  Resp: '15 18 17 19  ' Temp:      TempSrc:      SpO2: 91% 100% 100% 100%   Constitutional: Chronically ill-appearing man resting in bed, appears restless.  Awake but nonverbal, not following commands. Eyes: PERRL, lids and conjunctivae normal ENMT: Mucous membranes are dry. Posterior pharynx clear of any exudate or lesions.poor dentition.  Neck: normal, supple, no masses. Respiratory: clear to auscultation anteriorly.  Normal respiratory effort. No accessory muscle use.  Cardiovascular: Regular rate and rhythm, no murmurs / rubs / gallops. No extremity edema. Abdomen: no tenderness, no masses palpated. Musculoskeletal: Moving all extremities  spontaneously Skin: no rashes, lesions, ulcers. No induration Neurologic: Limited exam, moving all extremities spontaneously, strength and sensation appears intact Psychiatric: Nonverbal, not following commands, demented.  Labs on Admission: I have personally reviewed following labs and imaging studies  CBC: Recent Labs  Lab 01/16/2021 1409  WBC 5.4  NEUTROABS 2.5  HGB 12.2*  HCT 39.8  MCV 106.1*  PLT 165   Basic Metabolic Panel: Recent Labs  Lab 01/27/2021 1409  NA 165*  K 3.8  CL 126*  CO2 30  GLUCOSE 118*  BUN 47*  CREATININE 1.23  CALCIUM 10.6*   GFR: CrCl cannot be calculated (Unknown ideal weight.). Liver Function Tests: Recent Labs  Lab 02/09/2021 1409  AST 29  ALT 20  ALKPHOS 124  BILITOT 1.4*  PROT 6.9  ALBUMIN 3.7   No results for input(s): LIPASE, AMYLASE in the last 168 hours. No results for input(s): AMMONIA in the last 168 hours. Coagulation Profile: Recent Labs  Lab 02/08/2021 1409  INR 1.3*   Cardiac Enzymes: No results for input(s): CKTOTAL, CKMB, CKMBINDEX, TROPONINI in the last 168 hours. BNP (last 3 results) No results for input(s): PROBNP in the last 8760 hours. HbA1C: No results for input(s): HGBA1C in the last 72 hours. CBG: No results for input(s): GLUCAP in the last 168 hours. Lipid Profile: No results for input(s): CHOL, HDL, LDLCALC, TRIG, CHOLHDL, LDLDIRECT in the last 72 hours. Thyroid Function Tests: No results for input(s): TSH, T4TOTAL, FREET4, T3FREE, THYROIDAB in the last 72 hours. Anemia Panel: No results for input(s): VITAMINB12, FOLATE, FERRITIN, TIBC, IRON, RETICCTPCT in the last 72 hours. Urine analysis:    Component Value Date/Time   COLORURINE YELLOW 01/31/2021 1526   APPEARANCEUR TURBID (A) 02/03/2021 1526   LABSPEC 1.021 02/08/2021 1526   PHURINE 5.0 01/29/2021 1526   GLUCOSEU NEGATIVE 01/24/2021 1526   HGBUR SMALL (A) 02/11/2021 1526   BILIRUBINUR NEGATIVE 01/18/2021 [place] 02/01/2021  1526   PROTEINUR 100 (A) 02/08/2021 1526   NITRITE NEGATIVE 02/10/2021 1526   LEUKOCYTESUR MODERATE (A) 02/10/2021 1526    Radiological Exams on Admission: DG Chest 1 View  Result Date: 01/22/2021 CLINICAL DATA:  Pain status post fall EXAM: CHEST  1 VIEW COMPARISON:  None. 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 The lung volumes are low. There is no pneumothorax. No definite acute displaced fracture. There are end-stage degenerative changes of the right glenohumeral joint. Atelectasis is noted at the lung bases. Aortic calcifications are noted. The heart size is unremarkable. IMPRESSION: 1. No acute cardiopulmonary process. 2. Status post total shoulder  arthroplasty on the left without evidence for pneumothorax. 3. End-stage degenerative changes of the right glenohumeral joint. Electronically Signed   By: Constance Holster M.D.   On: 01/31/2021 15:17   DG Pelvis 1-2 Views  Result Date: 01/16/2021 CLINICAL DATA:  Pain status post fall EXAM: PELVIS - 1-2 VIEW COMPARISON:  None. FINDINGS: There is osteopenia which limits detection of nondisplaced fractures. There is no definite acute displaced fracture or dislocation. Evaluation of the parasymphyseal pubic bones is limited by overlapping dense stool within the rectum. There is a large amount of stool in the rectum. There are calcifications projecting over the lower left mid abdomen. Degenerative changes are noted of the bilateral hips and lower lumbar spine. IMPRESSION: 1. No definite acute displaced fracture or dislocation. Evaluation of the parasymphyseal pubic bones is limited by overlapping dense stool within the rectum. 2. Large amount of stool in the rectum. 3. Degenerative changes of the bilateral hips and lower lumbar spine. 4. Calcifications project over the left lower abdomen. These are of unknown clinical significance and may represent nephroliths. Electronically Signed   By: Constance Holster M.D.   On:  01/18/2021 15:17   DG Shoulder Right  Result Date: 01/26/2021 CLINICAL DATA:  Pain EXAM: RIGHT SHOULDER - 2+ VIEW COMPARISON:  None. FINDINGS: There are end-stage degenerative changes of the right glenohumeral joint. There is no evidence for an acute displaced fracture or dislocation. There is osteopenia which limits detection of nondisplaced fractures. IMPRESSION: 1. No acute displaced fracture or dislocation. 2. End-stage degenerative changes of the right glenohumeral joint. Electronically Signed   By: Constance Holster M.D.   On: 01/23/2021 15:20   DG Elbow Complete Right  Result Date: 01/27/2021 CLINICAL DATA:  Pain status post fall EXAM: RIGHT ELBOW - COMPLETE 3+ VIEW COMPARISON:  None. FINDINGS: There is no evidence of fracture, dislocation, or joint effusion. There is no evidence of arthropathy or other focal bone abnormality. Soft tissues are unremarkable. IMPRESSION: Negative. Electronically Signed   By: Constance Holster M.D.   On: 02/11/2021 15:18   CT Head Wo Contrast  Result Date: 02/01/2021 CLINICAL DATA:  Found on floor at nursing home, neck trauma, head trauma EXAM: CT HEAD WITHOUT CONTRAST CT CERVICAL SPINE WITHOUT CONTRAST TECHNIQUE: Multidetector CT imaging of the head and cervical spine was performed following the standard protocol without intravenous contrast. Multiplanar CT image reconstructions of the cervical spine were also generated. COMPARISON:  Two in 22 FINDINGS: CT HEAD FINDINGS Brain: Generalized atrophy. Normal ventricular morphology. No midline shift or mass effect. Small vessel chronic ischemic changes of deep cerebral white matter. No intracranial hemorrhage, mass lesion, evidence of acute infarction, or extra-axial fluid collection. Vascular: No hyperdense vessels. Atherosclerotic calcification at vertebral arteries. Skull: Demineralized but intact Sinuses/Orbits: Clear Other: N/A CT CERVICAL SPINE FINDINGS Alignment: Minimal anterolisthesis C4-C5. Remaining  alignments normal. Skull base and vertebrae: Multilevel facet degenerative changes. Multilevel disc space narrowing with few scattered endplate spurs. Vertebral body heights maintained. Skull base intact. No fracture, subluxation, or bone destruction. Soft tissues and spinal canal: Prevertebral soft tissues normal thickness. Significant atherosclerotic calcification in the carotid systems. Disc levels:  No specific abnormality Upper chest: Lung apices clear Other: N/A IMPRESSION: Atrophy with small vessel chronic ischemic changes of deep cerebral white matter. No acute intracranial abnormalities. 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 No acute cervical spine abnormalities. Electronically Signed   By: Lavonia Dana M.D.   On: 01/25/2021 15:35   CT Cervical Spine Wo  Contrast  Result Date: 01/31/2021 CLINICAL DATA:  Found on floor at nursing home, neck trauma, head trauma EXAM: CT HEAD WITHOUT CONTRAST CT CERVICAL SPINE WITHOUT CONTRAST TECHNIQUE: Multidetector CT imaging of the head and cervical spine was performed following the standard protocol without intravenous contrast. Multiplanar CT image reconstructions of the cervical spine were also generated. COMPARISON:  Two in 22 FINDINGS: CT HEAD FINDINGS Brain: Generalized atrophy. Normal ventricular morphology. No midline shift or mass effect. Small vessel chronic ischemic changes of deep cerebral white matter. No intracranial hemorrhage, mass lesion, evidence of acute infarction, or extra-axial fluid collection. Vascular: No hyperdense vessels. Atherosclerotic calcification at vertebral arteries. Skull: Demineralized but intact Sinuses/Orbits: Clear Other: N/A CT CERVICAL SPINE FINDINGS Alignment: Minimal anterolisthesis C4-C5. Remaining alignments normal. Skull base and vertebrae: Multilevel facet degenerative changes. Multilevel disc space narrowing with few scattered endplate spurs. Vertebral body heights maintained. Skull  base intact. No fracture, subluxation, or bone destruction. Soft tissues and spinal canal: Prevertebral soft tissues normal thickness. Significant atherosclerotic calcification in the carotid systems. Disc levels:  No specific abnormality Upper chest: Lung apices clear Other: N/A IMPRESSION: Atrophy with small vessel chronic ischemic changes of deep cerebral white matter. No acute intracranial abnormalities. 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 No acute cervical spine abnormalities. Electronically Signed   By: Lavonia Dana M.D.   On: 01/17/2021 15:35    EKG: Personally reviewed. Normal sinus rhythm, first-degree AV block without acute ischemic changes.  No prior for comparison.  Assessment/Plan Principal Problem:   Hypernatremia Active Problems:   Hypertension   Dementia with aggressive behavior ([place])   CAD (coronary artery disease)   Dehydration   Hypercalcemia   Johnathan Combs is a 80 y.o. 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  Severe hypernatremia and dehydration Dementia with aggressive behavior and nonverbal/nonambulatory status Progressive functional decline: Patient with previously known history of dementia who had significant functional decline after recent hospitalization at outside hospital.  Now is no longer eating on his own or with assistance and appears to be approaching end-of-life.  I had a long discussion with his son, Ileene Rubens, by phone who agrees that we should focus on comfort care measures with anticipation to move towards hospice care. -Hold further IV fluids and antibiotics -CODE STATUS is DNR -Tylenol, oral morphine, IV Dilaudid as needed for pain -Haldol as needed for agitation or delirium -Ativan as needed for anxiety -Can have comfort  feeds -No further lab draws -TOC and palliative care consults ordered  CAD s/p BMS 2012 Hypertension Hypercalcemia History of GI bleed Hold home meds, no further management.  DVT prophylaxis: None Code Status: DNR, confirmed with patient's son Family Communication: Discussed with patient's son by phone Disposition Plan: From a [place] called: None Level of care: Palliative Admission status:  Status is: Observation  The patient remains OBS appropriate and will d/c before 2 midnights.  Dispo: The patient is from: SNF              Anticipated d/c is to: Hospice              Anticipated d/c date is: 1 day              Patient currently is not medically stable to d/c.   Zada Finders MD [facility]  If 7PM-7AM, please contact night-coverage  [email or web address]  01/21/2021, 10:23 PM

## 2021-02-02 NOTE — ED Notes (Signed)
Critical lab- sodium 165

## 2021-02-03 ENCOUNTER — Encounter (HOSPITAL_COMMUNITY): Payer: Self-pay | Admitting: Internal Medicine

## 2021-02-03 ENCOUNTER — Other Ambulatory Visit: Payer: Self-pay

## 2021-02-03 DIAGNOSIS — R531 Weakness: Secondary | ICD-10-CM | POA: Diagnosis not present

## 2021-02-03 DIAGNOSIS — G9341 Metabolic encephalopathy: Secondary | ICD-10-CM | POA: Diagnosis present

## 2021-02-03 DIAGNOSIS — E872 Acidosis: Secondary | ICD-10-CM | POA: Diagnosis present

## 2021-02-03 DIAGNOSIS — Z66 Do not resuscitate: Secondary | ICD-10-CM

## 2021-02-03 DIAGNOSIS — F0391 Unspecified dementia with behavioral disturbance: Secondary | ICD-10-CM | POA: Diagnosis not present

## 2021-02-03 DIAGNOSIS — Z8052 Family history of malignant neoplasm of bladder: Secondary | ICD-10-CM | POA: Diagnosis not present

## 2021-02-03 DIAGNOSIS — Z79899 Other long term (current) drug therapy: Secondary | ICD-10-CM | POA: Diagnosis not present

## 2021-02-03 DIAGNOSIS — Z8619 Personal history of other infectious and parasitic diseases: Secondary | ICD-10-CM | POA: Diagnosis not present

## 2021-02-03 DIAGNOSIS — R532 Functional quadriplegia: Secondary | ICD-10-CM | POA: Diagnosis present

## 2021-02-03 DIAGNOSIS — Z7189 Other specified counseling: Secondary | ICD-10-CM | POA: Diagnosis not present

## 2021-02-03 DIAGNOSIS — E860 Dehydration: Secondary | ICD-10-CM

## 2021-02-03 DIAGNOSIS — Z515 Encounter for palliative care: Secondary | ICD-10-CM | POA: Diagnosis not present

## 2021-02-03 DIAGNOSIS — I2510 Atherosclerotic heart disease of native coronary artery without angina pectoris: Secondary | ICD-10-CM | POA: Diagnosis present

## 2021-02-03 DIAGNOSIS — I10 Essential (primary) hypertension: Secondary | ICD-10-CM | POA: Diagnosis present

## 2021-02-03 DIAGNOSIS — R54 Age-related physical debility: Secondary | ICD-10-CM | POA: Diagnosis present

## 2021-02-03 DIAGNOSIS — F0281 Dementia in other diseases classified elsewhere with behavioral disturbance: Secondary | ICD-10-CM | POA: Diagnosis present

## 2021-02-03 DIAGNOSIS — Z20822 Contact with and (suspected) exposure to covid-19: Secondary | ICD-10-CM | POA: Diagnosis present

## 2021-02-03 DIAGNOSIS — E870 Hyperosmolality and hypernatremia: Secondary | ICD-10-CM | POA: Diagnosis present

## 2021-02-03 DIAGNOSIS — Z9181 History of falling: Secondary | ICD-10-CM | POA: Diagnosis not present

## 2021-02-03 DIAGNOSIS — G309 Alzheimer's disease, unspecified: Secondary | ICD-10-CM | POA: Diagnosis present

## 2021-02-03 LAB — BLOOD CULTURE ID PANEL (REFLEXED) - BCID2

## 2021-02-03 LAB — SARS CORONAVIRUS 2 (TAT 6-24 HRS): SARS Coronavirus 2: NEGATIVE

## 2021-02-03 MED ORDER — ORAL CARE MOUTH RINSE
15.0000 mL | Freq: Two times a day (BID) | OROMUCOSAL | Status: DC
  Administered 2021-02-03 – 2021-02-07 (×8): 15 mL via OROMUCOSAL

## 2021-02-03 MED ORDER — LIP MEDEX EX OINT
TOPICAL_OINTMENT | CUTANEOUS | Status: AC
  Filled 2021-02-03: qty 7

## 2021-02-03 MED ORDER — CHLORHEXIDINE GLUCONATE 0.12 % MT SOLN
15.0000 mL | Freq: Two times a day (BID) | OROMUCOSAL | Status: DC
  Administered 2021-02-03 – 2021-02-07 (×8): 15 mL via OROMUCOSAL
  Filled 2021-02-03 (×7): qty 15

## 2021-02-03 NOTE — Progress Notes (Signed)
PHARMACY - PHYSICIAN COMMUNICATION CRITICAL VALUE ALERT - BLOOD CULTURE IDENTIFICATION (BCID)  Johnathan Combs is an 80 y.o. male who presented to [facility] on 01/30/2021 with a chief complaint of severe hypernatremia and dehydration  Assessment:  Staph species likely contamination (include suspected source if known)  Name of physician (or Provider) Contacted: Dr. Alanda Slim  Current antibiotics: none  Changes to prescribed antibiotics recommended:  No antibiotics planned, patient is comfort care  Results for orders placed or performed during the hospital encounter of 02/04/2021  Blood Culture ID Panel (Reflexed) (Collected: 01/25/2021  2:09 PM)  Result Value Ref Range   Enterococcus faecalis NOT DETECTED NOT DETECTED   Enterococcus Faecium NOT DETECTED NOT DETECTED   Listeria monocytogenes NOT DETECTED NOT DETECTED   Staphylococcus species DETECTED (A) NOT DETECTED   Staphylococcus aureus (BCID) NOT DETECTED NOT DETECTED   Staphylococcus epidermidis NOT DETECTED NOT DETECTED   Staphylococcus lugdunensis NOT DETECTED NOT DETECTED   Streptococcus species NOT DETECTED NOT DETECTED   Streptococcus agalactiae NOT DETECTED NOT DETECTED   Streptococcus pneumoniae NOT DETECTED NOT DETECTED   Streptococcus pyogenes NOT DETECTED NOT DETECTED   A.calcoaceticus-baumannii NOT DETECTED NOT DETECTED   Bacteroides fragilis NOT DETECTED NOT DETECTED   Enterobacterales NOT DETECTED NOT DETECTED   Enterobacter cloacae complex NOT DETECTED NOT DETECTED   Escherichia coli NOT DETECTED NOT DETECTED   Klebsiella aerogenes NOT DETECTED NOT DETECTED   Klebsiella oxytoca NOT DETECTED NOT DETECTED   Klebsiella pneumoniae NOT DETECTED NOT DETECTED   Proteus species NOT DETECTED NOT DETECTED   Salmonella species NOT DETECTED NOT DETECTED   Serratia marcescens NOT DETECTED NOT DETECTED   Haemophilus influenzae NOT DETECTED NOT DETECTED   Neisseria meningitidis NOT DETECTED NOT DETECTED   Pseudomonas aeruginosa  NOT DETECTED NOT DETECTED   Stenotrophomonas maltophilia NOT DETECTED NOT DETECTED   Candida albicans NOT DETECTED NOT DETECTED   Candida auris NOT DETECTED NOT DETECTED   Candida glabrata NOT DETECTED NOT DETECTED   Candida krusei NOT DETECTED NOT DETECTED   Candida parapsilosis NOT DETECTED NOT DETECTED   Candida tropicalis NOT DETECTED NOT DETECTED   Cryptococcus neoformans/gattii NOT DETECTED NOT DETECTED    Loralee Pacas, PharmD, BCPS Pharmacy: [phone] 02/03/2021  11:53 AM

## 2021-02-03 NOTE — Progress Notes (Signed)
PROGRESS NOTE  Johnathan Combs YHC:[id number] DOB: 29-Sep-1941   PCP: Pcp, No  Patient is from: SNF  DOA: 16-Feb-2021 LOS: 0  Chief complaints: Found down on the floor  Brief Narrative / Interim history: 80 year old M with PMH of dementia, nonverbal, nonambulatory, CAD s/p BMS in 2012, HTN, recent hospitalization at [facility] for MSSA bacteremia with possible endocarditis and GI bleed brought to ED after found down on the floor for unknown period of time.  He had poor p.o. intake.  He was encephalopathic with severe hypernatremia and dehydration.  He was admitted for end-of-life care.   Subjective: Seen and examined earlier this morning.  No major events overnight or this morning.  No apparent distress.  Objective: Vitals:   02/16/21 2230 2021/02/16 2300 02/03/21 0015 02/03/21 0044  BP: (!) 145/95 (!) 157/85 106/74   Pulse: 81 81 62   Resp: (!) 24 19 14    Temp:      TempSrc:      SpO2: 98% 99% 100%   Weight:    59.4 kg    Intake/Output Summary (Last 24 hours) at 02/03/2021 1532 Last data filed at 02/03/2021 0351 Gross per 24 hour  Intake 0 ml  Output 200 ml  Net -200 ml   Filed Weights   02/03/21 0044  Weight: 59.4 kg    Examination:  GENERAL: Frail and chronically ill-appearing. RESP:  No IWOB. MSK/EXT: No apparent deformity. NEURO: Sleepy.   PSYCH: Calm.  No distress or agitation  Procedures:  None  Microbiology summarized: COVID-19 PCR nonreactive. Blood culture with staph species but one venipuncture.  Assessment & Plan: End-of-life care/comfort measures only/DNR/DNI -Full comfort pathway -Appreciate help by palliative medicine -Anticipate in hospital days.  Abnormal blood culture Severe hypernatremia due to dehydration in the setting of poor p.o. 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         DVT prophylaxis:  None.  Full comfort care.  Code Status: DNR/DNI. Family Communication: Patient's 2013. Level of care: Med-Surg Status is: Inpatient  Remains inpatient appropriate because:Unsafe d/c plan   Dispo: The patient is from: SNF              Anticipated d/c is to: Anticipate in hospital death              Anticipated d/c date is: 1 day              Patient currently is not medically stable to d/c.   Difficult to place patient No       Consultants:  Palliative medicine   Sch Meds:  Scheduled Meds: . chlorhexidine  15 mL Mouth Rinse BID  . mouth rinse  15 mL Mouth Rinse q12n4p   Continuous Infusions: PRN Meds:.acetaminophen **OR** acetaminophen, antiseptic oral rinse, glycopyrrolate **OR** glycopyrrolate **OR** glycopyrrolate, haloperidol **OR** haloperidol **OR** haloperidol lactate, HYDROmorphone (DILAUDID) injection, LORazepam **OR** LORazepam **OR** LORazepam, morphine CONCENTRATE **OR** morphine CONCENTRATE, ondansetron **OR** ondansetron (ZOFRAN) IV, polyvinyl alcohol  Antimicrobials: Anti-infectives (From admission, onward)   Start     Dose/Rate Route Frequency Ordered Stop   02/16/2021 1900  cefTRIAXone (ROCEPHIN) 1 g in sodium chloride 0.9 % 100 mL IVPB        1 g 200 mL/hr over 30 Minutes Intravenous  Once 2021/02/16 1849 February 16, 2021 2050  I have personally reviewed the following labs and images: CBC: Recent Labs  Lab 02/09/2021 1409  WBC 5.4  NEUTROABS 2.5  HGB 12.2*  HCT 39.8  MCV 106.1*  PLT 211   BMP &GFR Recent Labs  Lab 2021/02/09 1409  NA 165*  K 3.8  CL 126*  CO2 30  GLUCOSE 118*  BUN 47*  CREATININE 1.23  CALCIUM 10.6*   CrCl cannot be calculated (Unknown ideal weight.). Liver & Pancreas: Recent Labs  Lab Feb 09, 2021 1409  AST 29  ALT 20  ALKPHOS 124  BILITOT 1.4*  PROT 6.9  ALBUMIN 3.7   No  results for input(s): LIPASE, AMYLASE in the last 168 hours. No results for input(s): AMMONIA in the last 168 hours. Diabetic: No results for input(s): HGBA1C in the last 72 hours. No results for input(s): GLUCAP in the last 168 hours. Cardiac Enzymes: No results for input(s): CKTOTAL, CKMB, CKMBINDEX, TROPONINI in the last 168 hours. No results for input(s): PROBNP in the last 8760 hours. Coagulation Profile: Recent Labs  Lab February 09, 2021 1409  INR 1.3*   Thyroid Function Tests: No results for input(s): TSH, T4TOTAL, FREET4, T3FREE, THYROIDAB in the last 72 hours. Lipid Profile: No results for input(s): CHOL, HDL, LDLCALC, TRIG, CHOLHDL, LDLDIRECT in the last 72 hours. Anemia Panel: No results for input(s): VITAMINB12, FOLATE, FERRITIN, TIBC, IRON, RETICCTPCT in the last 72 hours. Urine analysis:    Component Value Date/Time   COLORURINE YELLOW 02/09/21 1526   APPEARANCEUR TURBID (A) 02-09-21 1526   LABSPEC 1.021 02/09/21 1526   PHURINE 5.0 2021/02/09 1526   GLUCOSEU NEGATIVE 02-09-2021 1526   HGBUR SMALL (A) 09-Feb-2021 1526   BILIRUBINUR NEGATIVE Feb 09, 2021 1526   KETONESUR NEGATIVE February 09, 2021 1526   PROTEINUR 100 (A) 02-09-21 1526   NITRITE NEGATIVE 09-Feb-2021 1526   LEUKOCYTESUR MODERATE (A) 02/09/2021 1526   Sepsis Labs: Invalid input(s): PROCALCITONIN, LACTICIDVEN  Microbiology: Recent Results (from the past 240 hour(s))  Blood culture (routine single)     Status: None (Preliminary result)   Collection Time: 09-Feb-2021  2:09 PM   Specimen: BLOOD  Result Value Ref Range Status   Specimen Description   Final    BLOOD BLOOD LEFT FOREARM Performed at [facility], [address], [place], [place] [address]    Special Requests   Final    BOTTLES DRAWN AEROBIC AND ANAEROBIC Blood Culture adequate volume Performed at [facility], [address], [place], [place] [address]    Culture  Setup Time   Final    GRAM POSITIVE COCCI  IN CLUSTERS IN BOTH AEROBIC AND ANAEROBIC BOTTLES CRITICAL RESULT CALLED TO, READ BACK BY AND VERIFIED WITH: SCOTT CHRISTY PHARMD @1139  02/03/21 EB Performed at [facility], [address] [place], [place]    Culture GRAM POSITIVE COCCI  Final   Report Status PENDING  Incomplete  Blood Culture ID Panel (Reflexed)     Status: Abnormal   Collection Time: February 09, 2021  2:09 PM  Result Value Ref Range Status   Enterococcus faecalis NOT DETECTED NOT DETECTED Final   Enterococcus Faecium NOT DETECTED NOT DETECTED Final   Listeria monocytogenes NOT DETECTED NOT DETECTED Final   Staphylococcus species DETECTED (A) NOT DETECTED Final    Comment: CRITICAL RESULT CALLED TO, READ BACK BY AND VERIFIED WITH: SCOTT CHRISTY PHARMD @1139  02/03/21 EB    Staphylococcus aureus (BCID) NOT DETECTED NOT DETECTED Final   Staphylococcus epidermidis NOT DETECTED NOT DETECTED Final   Staphylococcus lugdunensis  NOT DETECTED NOT DETECTED Final   Streptococcus species NOT DETECTED NOT DETECTED Final   Streptococcus agalactiae NOT DETECTED NOT DETECTED Final   Streptococcus pneumoniae NOT DETECTED NOT DETECTED Final   Streptococcus pyogenes NOT DETECTED NOT DETECTED Final   A.calcoaceticus-baumannii NOT DETECTED NOT DETECTED Final   Bacteroides fragilis NOT DETECTED NOT DETECTED Final   Enterobacterales NOT DETECTED NOT DETECTED Final   Enterobacter cloacae complex NOT DETECTED NOT DETECTED Final   Escherichia coli NOT DETECTED NOT DETECTED Final   Klebsiella aerogenes NOT DETECTED NOT DETECTED Final   Klebsiella oxytoca NOT DETECTED NOT DETECTED Final   Klebsiella pneumoniae NOT DETECTED NOT DETECTED Final   Proteus species NOT DETECTED NOT DETECTED Final   Salmonella species NOT DETECTED NOT DETECTED Final   Serratia marcescens NOT DETECTED NOT DETECTED Final   Haemophilus influenzae NOT DETECTED NOT DETECTED Final   Neisseria meningitidis NOT DETECTED NOT DETECTED Final   Pseudomonas aeruginosa  NOT DETECTED NOT DETECTED Final   Stenotrophomonas maltophilia NOT DETECTED NOT DETECTED Final   Candida albicans NOT DETECTED NOT DETECTED Final   Candida auris NOT DETECTED NOT DETECTED Final   Candida glabrata NOT DETECTED NOT DETECTED Final   Candida krusei NOT DETECTED NOT DETECTED Final   Candida parapsilosis NOT DETECTED NOT DETECTED Final   Candida tropicalis NOT DETECTED NOT DETECTED Final   Cryptococcus neoformans/gattii NOT DETECTED NOT DETECTED Final    Comment: Performed at [facility], [address], [place], [place] [address]  SARS CORONAVIRUS 2 (TAT 6-24 HRS) Nasopharyngeal Nasopharyngeal Swab     Status: None   Collection Time: 01/30/2021  8:27 PM   Specimen: Nasopharyngeal Swab  Result Value Ref Range Status   SARS Coronavirus 2 NEGATIVE NEGATIVE Final    Comment: (NOTE) SARS-CoV-2 target nucleic acids are NOT DETECTED.  The SARS-CoV-2 RNA is generally detectable in upper and lower respiratory specimens during the acute phase of infection. Negative results do not preclude SARS-CoV-2 infection, do not rule out co-infections with other pathogens, and should not be used as the sole basis for treatment or other patient management decisions. Negative results must be combined with clinical observations, patient history, and epidemiological information. The expected result is Negative.  Fact Sheet for Patients: HairSlick.no  Fact Sheet for Healthcare Providers: quierodirigir.com  This test is not yet approved or cleared by the [place] FDA and  has been authorized for detection and/or diagnosis of SARS-CoV-2 by FDA under an Emergency Use Authorization (EUA). This EUA will remain  in effect (meaning this test can be used) for the duration of the COVID-19 declaration under Se ction 564(b)(1) of the Act, 21 U.S.C. section 360bbb-3(b)(1), unless the authorization is terminated or revoked sooner.  Performed  at [facility], [address] [place], [place] [address]     Radiology Studies: No results found.    Shakura Cowing T. Woodroe Vogan [facility]  If 7PM-7AM, please contact night-coverage [email or web address] 02/03/2021, 3:32 PM

## 2021-02-03 NOTE — Consult Note (Signed)
Consultation Note Date: 02/03/2021   Patient Name: Johnathan Combs  DOB: 1941/03/10  MRN: [id number]  Age / Sex: 80 y.o., male  PCP: Pcp, No Referring Physician: Almon HerculesGonfa, Taye T, MD  Reason for Consultation: Establishing goals of care  HPI/Patient Profile: 80 y.o. male  admitted on August 08, 2021   Patient goes by "Johnathan Combs"  Clinical Assessment and Goals of Care:  Johnathan Combs is a 80 y.o. 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   Patient [facility]-01/02/2021.  Patient was initially hospitalized for incarcerated inguinal hernia and underwent laparoscopic repair on 11/21/2020.  Hospital course was complicated by sepsis due to MSSA bacteremia.  There was concern for endocarditis on TTE.  TEE was not pursued per family wishes.  Patient was initially treated with cefazolin followed by 14 days oxacillin.  During hospitalization he was also noted to have a GI bleed.  Further evaluation with upper and lower endoscopy was recommended however family did not want to pursue those procedures as they did not want any procedures under anesthesia.  Patient's aspirin was discontinued.    Patient had a significant functional decline during hospital stay resulting in and is now largely nonverbal and nonambulatory status.  Patient was ultimately discharged to SNF.  Patient reportedly at the SNF was found to have declining oral intake, not able to verbalize any of his needs, and was apparently found down at his facility, suspected unwitnessed fall, brought to the emergency department for further evaluation and admitted to hospital medicine service for dehydration, severe hyper natremia, dementia with aggressive  behavior, patient being nonverbal and nonambulatory status with progressive ongoing functional as well as cognitive decline.  A CODE STATUS of DNR/DNI was elected, full scope of comfort measures was deemed to be the most appropriate option.  Palliative consult for ongoing goals of care discussions and other supportive measures has been requested.  Patient is noted to be an elderly gentleman resting in bed.  His eyes are open however he has irregular respirations, does not respond or verbalize, does not follow commands, does not track me in the room.  Call placed and discussed with son [name].  Reduce myself and palliative care as follows:  Palliative medicine is specialized medical care for people living with serious illness. It focuses on providing relief from the symptoms and stress of a serious illness. The goal is to improve quality of life for both the patient and the family.  Goals of care: Broad aims of medical therapy in relation to the patient's values and preferences. Our aim is to provide medical care aimed at enabling patients to achieve the goals that matter most to them, given the circumstances of their particular medical situation and their constraints.   Goals wishes and values important to the patient and family as a unit attempted to be explored.  Brief life review  performed.  Patient's son lives in [place], patient's wife lives near [place].  Overall focus is for continuation of comfort measures.  Patient has advanced directive stating DNR and no feeding tubes.  Patient has had a prolonged hospitalization and ongoing functional cognitive decline.  We discussed about end-of-life signs and symptoms.  We discussed about the type of care that can be provided inside a residential hospice.  Son is agreeable.  See below.  HCPOA    SUMMARY OF RECOMMENDATIONS   DNR/DNI.  We discussed with son Mr. Johnathan Snare. Continue comfort measures Offer comfort feeds if  safe and appropriate: Applesauce Jell-O pudding ice cream consistency foods if patient is awake/alert as per son's request. Residential hospice locally if appropriate.  Overall focus is to prioritize avoidance of suffering, full scope of comfort measures and allowing for a peaceful death as per my discussions with the patient's son Mr. Johnathan Snare. Thank you for the consult.  Code Status/Advance Care Planning:  DNR    Symptom Management:      Palliative Prophylaxis:   Delirium Protocol  Additional Recommendations (Limitations, Scope, Preferences):  Full Comfort Care  Psycho-social/Spiritual:   Desire for further Chaplaincy support:yes  Additional Recommendations: Education on Hospice  Prognosis:   < 2 weeks  Discharge Planning: Hospice facility      Primary Diagnoses: Present on Admission: . CAD (coronary artery disease) . Dementia with aggressive behavior (HCC) . Hypertension . Hypernatremia . Dehydration . Hypercalcemia   I have reviewed the medical record, interviewed the patient and family, and examined the patient. The following aspects are pertinent.  Past Medical History:  Diagnosis Date  . CAD (coronary artery disease)   . Dementia with aggressive behavior (HCC)   . Hypertension    Social History   Socioeconomic History  . Marital status: Unknown    Spouse name: Not on file  . Number of children: Not on file  . Years of education: Not on file  . Highest education level: Not on file  Occupational History  . Not on file  Tobacco Use  . Smoking status: Not on file  . Smokeless tobacco: Not on file  Substance and Sexual Activity  . Alcohol use: Not on file  . Drug use: Not on file  . Sexual activity: Not on file  Other Topics Concern  . Not on file  Social History Narrative  . Not on file   Social Determinants of Health   Financial Resource Strain: Not on file  Food Insecurity: Not on file  Transportation Needs: Not on file  Physical  Activity: Not on file  Stress: Not on file  Social Connections: Not on file   Family History  Problem Relation Age of Onset  . Bladder Cancer Father    Scheduled Meds: . chlorhexidine  15 mL Mouth Rinse BID  . mouth rinse  15 mL Mouth Rinse q12n4p   Continuous Infusions: PRN Meds:.acetaminophen **OR** acetaminophen, antiseptic oral rinse, glycopyrrolate **OR** glycopyrrolate **OR** glycopyrrolate, haloperidol **OR** haloperidol **OR** haloperidol lactate, HYDROmorphone (DILAUDID) injection, LORazepam **OR** LORazepam **OR** LORazepam, morphine CONCENTRATE **OR** morphine CONCENTRATE, ondansetron **OR** ondansetron (ZOFRAN) IV, polyvinyl alcohol Medications Prior to Admission:  Prior to Admission medications   Medication Sig Start Date End Date Taking? Authorizing Provider  acetaminophen (TYLENOL) 325 MG tablet Take 650 mg by mouth every 6 (six) hours as needed for mild pain.   Yes [provider]  amLODipine (NORVASC) 5 MG tablet Take 5 mg by mouth daily.   Yes [provider]  carboxymethylcellulose (REFRESH PLUS) 0.5 % SOLN Place 1 drop into both eyes 3 (three) times daily as needed (dry eyes).   Yes [provider]  cyanocobalamin 1000 MCG tablet Take 1,000 mcg by mouth daily.   Yes [provider]  Lidocaine 4 % PTCH Apply 1 patch topically daily at 6 (six) AM. To shoulder   Yes [provider]  melatonin 3 MG TABS tablet Take 3 mg by mouth at bedtime.   Yes [provider]  melatonin 5 MG TABS Take 5 mg by mouth daily. 8 am   Yes [provider]  metoprolol succinate (TOPROL-XL) 25 MG 24 hr tablet Take 25 mg by mouth daily.   Yes [provider]  mupirocin ointment (BACTROBAN) 2 % Apply 1 application topically 2 (two) times daily. Apply to left inguinal incision   Yes [provider]  niacin 500 MG tablet Take 500 mg by mouth at bedtime.   Yes [provider]  Nutritional Supplements (NUTRITIONAL  SHAKE HIGH PROTEIN) LIQD Take 1 Container by mouth in the morning and at bedtime.   Yes [provider]  pantoprazole (PROTONIX) 40 MG tablet Take 40 mg by mouth daily.   Yes [provider]  polyethylene glycol (MIRALAX / GLYCOLAX) 17 g packet Take 17 g by mouth 2 (two) times daily.   Yes [provider]  potassium chloride SA (KLOR-CON) 20 MEQ tablet Take 20 mEq by mouth daily.   Yes [provider]  prednisoLONE acetate (PRED FORTE) 1 % ophthalmic suspension Place 1 drop into the right eye daily.   Yes [provider]  sucralfate (CARAFATE) 1 g tablet Take 1 g by mouth 2 (two) times daily.   Yes [provider]  traMADol (ULTRAM) 50 MG tablet Take 50 mg by mouth every 8 (eight) hours as needed for moderate pain.   Yes [provider]  valproic acid (DEPAKENE) 250 MG/5ML solution Take 250 mg by mouth 2 (two) times daily.   Yes [provider]  Vitamin E 450 MG (1000 UT) CAPS Take 450 mg by mouth daily.   Yes [provider]   Allergies  Allergen Reactions  . Lidocaine Other (See Comments)    unknown  . Sweet Potato Other (See Comments)    unknown  . Wild Yam [Dioscorea Villosa] Other (See Comments)    unknown   Review of Systems Non verbal  Physical Exam Weak and pale appearing elderly gentleman Not awake not alert Not following commands Not verbal Shallow breath sounds with some pauses/apneic spells evident No mottling no coolness of extremities No edema Has baseline dementia S 1 S 2   Vital Signs: BP 106/74   Pulse 62   Temp (!) 96.6 F (35.9 C) (Rectal)   Resp 14   Wt 59.4 kg   SpO2 100%  Pain Scale: Faces   Pain Score: Asleep   SpO2: SpO2: 100 % O2 Device:SpO2: 100 % O2 Flow Rate: .   IO: Intake/output summary:   Intake/Output Summary (Last 24 hours) at 02/03/2021 1125 Last data filed at 02/03/2021 0351 Gross per 24 hour  Intake 0 ml  Output 200 ml  Net -200 ml    LBM: Last  BM Date:  (PTA) Baseline Weight: Weight: 59.4 kg Most recent weight: Weight: 59.4 kg     Palliative Assessment/Data:   PPS 20%  Time In:  10 Time Out:  11 Time Total:   60  Greater than 50%  of  this time was spent counseling and coordinating care related to the above assessment and plan.  Signed by: Rosalin Hawking, MD   Please contact Palliative Medicine Team phone at [phone] for questions and concerns.  For individual provider: See Loretha Stapler

## 2021-02-03 NOTE — Progress Notes (Signed)
Patient arrived after midnight with no family at bedside. Patient was lethargic, non-verbal after receiving medication for agitation in the ED. Patient at baseline has dementia and is a poor historian. I was unable to obtain an admission history on this patient. Will see if day RN can obtain from son later this morning. Johnathan Combs

## 2021-02-04 DIAGNOSIS — Z515 Encounter for palliative care: Secondary | ICD-10-CM | POA: Diagnosis not present

## 2021-02-04 DIAGNOSIS — E860 Dehydration: Secondary | ICD-10-CM | POA: Diagnosis not present

## 2021-02-04 DIAGNOSIS — E870 Hyperosmolality and hypernatremia: Secondary | ICD-10-CM | POA: Diagnosis not present

## 2021-02-04 DIAGNOSIS — Z66 Do not resuscitate: Secondary | ICD-10-CM | POA: Diagnosis not present

## 2021-02-04 LAB — URINE CULTURE: Culture: <10000 — AB

## 2021-02-04 MED ORDER — HYDROMORPHONE HCL 1 MG/ML IJ SOLN
1.0000 mg | INTRAMUSCULAR | Status: DC | PRN
Start: 2021-02-04 — End: 2021-02-05
  Administered 2021-02-04 – 2021-02-05 (×5): 1 mg via INTRAVENOUS
  Filled 2021-02-04 (×6): qty 1

## 2021-02-04 NOTE — TOC Transition Note (Signed)
Transition of Care [facility]) - CM/SW Discharge Note   Patient Details  Name: Johnathan Combs MRN: [id number] Date of Birth: 1941/07/14  [facility] CM/SW Contact:  Bartholome Bill, RN Phone Number: 02/04/2021, 2:04 PM   Clinical Narrative:    [facility] consult for hospice referral. Per MD today it appears patient be a hospital death. TOC will continue to follow.

## 2021-02-04 NOTE — Progress Notes (Addendum)
PROGRESS NOTE  Johnathan Combs VQQ:[id number] DOB: September 13, 1941   PCP: Patient, No Pcp Per  Patient is from: SNF  DOA: February 25, 2021 LOS: 1  Chief complaints: Found down on the floor  Brief Narrative / Interim history: 80 year old M with PMH of dementia, nonverbal, nonambulatory, CAD s/p BMS in 2012, HTN, recent hospitalization at [facility] for MSSA bacteremia with possible endocarditis and GI bleed brought to ED after found down on the floor for unknown period of time.  He had poor p.o. intake.  He was encephalopathic with severe hypernatremia and dehydration.  He was admitted for end-of-life care.   Subjective: Seen this morning. Slow respiratory rate but no increased work of breathing. No apparent distress.  Objective: Vitals:   February 25, 2021 2300 02/03/21 0015 02/03/21 0044 02/04/21 1139  BP: (!) 157/85 106/74  (!) 138/91  Pulse: 81 62    Resp: 19 14  (!) 7  Temp:    97.9 F (36.6 C)  TempSrc:    Oral  SpO2: 99% 100%  94%  Weight:   59.4 kg     Intake/Output Summary (Last 24 hours) at 02/04/2021 2029 Last data filed at 02/04/2021 1605 Gross per 24 hour  Intake 0 ml  Output --  Net 0 ml   Filed Weights   02/03/21 0044  Weight: 59.4 kg    Examination:  GENERAL: Frail and chronically ill -appearing.  RESP: No IWOB.  MSK/EXT: No apparent deformity.  NEURO:Somnolent PSYCH: Calm. No distress.    Procedures:  None  Microbiology summarized: COVID-19 PCR nonreactive. Blood culture with staph species but one venipuncture.  Assessment & Plan: End-of-life care/comfort measures only/DNR/DNI -Continue full comfort pathway -Appreciate help by palliative medicine -Anticipate in hospital death.  Abnormal blood culture Acute metabolic encephalopathy due to severe hypernatremia and dehydration Severe hypernatremia due to dehydration in the setting of poor p.o. 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         DVT prophylaxis:  None.  Full comfort care.  Code Status: DNR/DNI. Family Communication: Patient's Charity fundraiser. Level of care: Med-Surg Status is: Inpatient  Remains inpatient appropriate because:Unsafe d/c plan   Dispo: The patient is from: SNF              Anticipated d/c is to:  Anticipate in hospital death              Anticipated d/c date is: 1 day              Patient currently is not medically stable to d/c.   Difficult to place patient No       Consultants:  Palliative medicine   Sch Meds:  Scheduled Meds:  chlorhexidine  15 mL Mouth Rinse BID   mouth rinse  15 mL Mouth Rinse q12n4p   Continuous Infusions:  PRN Meds:.acetaminophen **OR** acetaminophen, antiseptic oral rinse, glycopyrrolate **OR** glycopyrrolate **OR** glycopyrrolate, haloperidol **OR** haloperidol **OR** haloperidol lactate, HYDROmorphone (DILAUDID) injection, LORazepam **OR** LORazepam **OR** LORazepam, morphine CONCENTRATE **OR** morphine CONCENTRATE, ondansetron **OR** ondansetron (ZOFRAN) IV, polyvinyl alcohol  Antimicrobials: Anti-infectives (From admission, onward)    Start     Dose/Rate Route Frequency Ordered Stop   02/25/2021 1900  cefTRIAXone (ROCEPHIN) 1 g in sodium chloride 0.9 % 100 mL IVPB        1 g  200 mL/hr over 30 Minutes Intravenous  Once 2021-03-04 1849 2021/03/04 2050        I have personally reviewed the following labs and images: CBC: Recent Labs  Lab 03-04-2021 1409  WBC 5.4  NEUTROABS 2.5  HGB 12.2*  HCT 39.8  MCV 106.1*  PLT 211   BMP &GFR Recent Labs  Lab 04-Mar-2021 1409  NA 165*  K 3.8  CL 126*  CO2 30  GLUCOSE 118*  BUN 47*  CREATININE 1.23  CALCIUM 10.6*   CrCl cannot be calculated (Unknown ideal weight.). Liver & Pancreas: Recent Labs  Lab  2021-03-04 1409  AST 29  ALT 20  ALKPHOS 124  BILITOT 1.4*  PROT 6.9  ALBUMIN 3.7   No results for input(s): LIPASE, AMYLASE in the last 168 hours. No results for input(s): AMMONIA in the last 168 hours. Diabetic: No results for input(s): HGBA1C in the last 72 hours. No results for input(s): GLUCAP in the last 168 hours. Cardiac Enzymes: No results for input(s): CKTOTAL, CKMB, CKMBINDEX, TROPONINI in the last 168 hours. No results for input(s): PROBNP in the last 8760 hours. Coagulation Profile: Recent Labs  Lab March 04, 2021 1409  INR 1.3*   Thyroid Function Tests: No results for input(s): TSH, T4TOTAL, FREET4, T3FREE, THYROIDAB in the last 72 hours. Lipid Profile: No results for input(s): CHOL, HDL, LDLCALC, TRIG, CHOLHDL, LDLDIRECT in the last 72 hours. Anemia Panel: No results for input(s): VITAMINB12, FOLATE, FERRITIN, TIBC, IRON, RETICCTPCT in the last 72 hours. Urine analysis:    Component Value Date/Time   COLORURINE YELLOW March 04, 2021 1526   APPEARANCEUR TURBID (A) 03-04-21 1526   LABSPEC 1.021 03-04-2021 1526   PHURINE 5.0 Mar 04, 2021 1526   GLUCOSEU NEGATIVE 2021-03-04 1526   HGBUR SMALL (A) 03-04-21 1526   BILIRUBINUR NEGATIVE 03-04-21 1526   KETONESUR NEGATIVE 2021/03/04 1526   PROTEINUR 100 (A) 2021/03/04 1526   NITRITE NEGATIVE Mar 04, 2021 1526   LEUKOCYTESUR MODERATE (A) March 04, 2021 1526   Sepsis Labs: Invalid input(s): PROCALCITONIN, LACTICIDVEN  Microbiology: Recent Results (from the past 240 hour(s))  Blood culture (routine single)     Status: Abnormal (Preliminary result)   Collection Time: Mar 04, 2021  2:09 PM   Specimen: BLOOD  Result Value Ref Range Status   Specimen Description   Final    BLOOD BLOOD LEFT FOREARM Performed at [facility], [address] [place], [place] [address]    Special Requests   Final    BOTTLES DRAWN AEROBIC AND ANAEROBIC Blood Culture adequate volume Performed at [facility],  [address], [place], [place] [address]    Culture  Setup Time   Final    GRAM POSITIVE COCCI IN CLUSTERS IN BOTH AEROBIC AND ANAEROBIC BOTTLES CRITICAL RESULT CALLED TO, READ BACK BY AND VERIFIED WITH: SCOTT CHRISTY PHARMD @1139  02/03/21 EB    Culture (A)  Final    STAPHYLOCOCCUS HAEMOLYTICUS THE SIGNIFICANCE OF ISOLATING THIS ORGANISM FROM A SINGLE VENIPUNCTURE CANNOT BE PREDICTED WITHOUT FURTHER CLINICAL AND CULTURE CORRELATION. SUSCEPTIBILITIES AVAILABLE ONLY ON REQUEST. Performed at [facility], [address], [place], [place]    Report Status PENDING  Incomplete  Blood Culture ID Panel (Reflexed)     Status: Abnormal   Collection Time: 03-04-2021  2:09 PM  Result Value Ref Range Status   Enterococcus faecalis NOT DETECTED NOT DETECTED Final   Enterococcus Faecium NOT DETECTED NOT DETECTED Final   Listeria monocytogenes NOT DETECTED NOT DETECTED Final   Staphylococcus species DETECTED (A)  NOT DETECTED Final    Comment: CRITICAL RESULT CALLED TO, READ BACK BY AND VERIFIED WITH: SCOTT CHRISTY PHARMD @1139  02/03/21 EB    Staphylococcus aureus (BCID) NOT DETECTED NOT DETECTED Final   Staphylococcus epidermidis NOT DETECTED NOT DETECTED Final   Staphylococcus lugdunensis NOT DETECTED NOT DETECTED Final   Streptococcus species NOT DETECTED NOT DETECTED Final   Streptococcus agalactiae NOT DETECTED NOT DETECTED Final   Streptococcus pneumoniae NOT DETECTED NOT DETECTED Final   Streptococcus pyogenes NOT DETECTED NOT DETECTED Final   A.calcoaceticus-baumannii NOT DETECTED NOT DETECTED Final   Bacteroides fragilis NOT DETECTED NOT DETECTED Final   Enterobacterales NOT DETECTED NOT DETECTED Final   Enterobacter cloacae complex NOT DETECTED NOT DETECTED Final   Escherichia coli NOT DETECTED NOT DETECTED Final   Klebsiella aerogenes NOT DETECTED NOT DETECTED Final   Klebsiella oxytoca NOT DETECTED NOT DETECTED Final   Klebsiella pneumoniae NOT DETECTED NOT DETECTED  Final   Proteus species NOT DETECTED NOT DETECTED Final   Salmonella species NOT DETECTED NOT DETECTED Final   Serratia marcescens NOT DETECTED NOT DETECTED Final   Haemophilus influenzae NOT DETECTED NOT DETECTED Final   Neisseria meningitidis NOT DETECTED NOT DETECTED Final   Pseudomonas aeruginosa NOT DETECTED NOT DETECTED Final   Stenotrophomonas maltophilia NOT DETECTED NOT DETECTED Final   Candida albicans NOT DETECTED NOT DETECTED Final   Candida auris NOT DETECTED NOT DETECTED Final   Candida glabrata NOT DETECTED NOT DETECTED Final   Candida krusei NOT DETECTED NOT DETECTED Final   Candida parapsilosis NOT DETECTED NOT DETECTED Final   Candida tropicalis NOT DETECTED NOT DETECTED Final   Cryptococcus neoformans/gattii NOT DETECTED NOT DETECTED Final    Comment: Performed at [facility], [address], [place], [place]  Urine culture     Status: Abnormal   Collection Time: 02/18/21  3:26 PM   Specimen: Urine, Catheterized  Result Value Ref Range Status   Specimen Description   Final    URINE, CATHETERIZED Performed at [facility], [address], [place], [place]    Special Requests   Final    NONE Performed at [facility], [address] [place], [place]    Culture (A)  Final    <10,000 COLONIES/mL INSIGNIFICANT GROWTH Performed at [facility], [address], [place], [place]    Report Status 02/04/2021 FINAL  Final  SARS CORONAVIRUS 2 (TAT 6-24 HRS) Nasopharyngeal Nasopharyngeal Swab     Status: None   Collection Time: 2021-02-18  8:27 PM   Specimen: Nasopharyngeal Swab  Result Value Ref Range Status   SARS Coronavirus 2 NEGATIVE NEGATIVE Final    Comment: (NOTE) SARS-CoV-2 target nucleic acids are NOT DETECTED.  The SARS-CoV-2 RNA is generally detectable in upper and lower respiratory specimens during the acute phase of infection. Negative results do not preclude  SARS-CoV-2 infection, do not rule out co-infections with other pathogens, and should not be used as the sole basis for treatment or other patient management decisions. Negative results must be combined with clinical observations, patient history, and epidemiological information. The expected result is Negative.  Fact Sheet for Patients: 02/04/21  Fact Sheet for Healthcare Providers: HairSlick.no  This test is not yet approved or cleared by the quierodirigir.com FDA and  has been authorized for detection and/or diagnosis of SARS-CoV-2 by FDA under an Emergency Use Authorization (EUA). This EUA will remain  in effect (meaning this test can be used)  for the duration of the COVID-19 declaration under Se ction 564(b)(1) of the Act, 21 U.S.C. section 360bbb-3(b)(1), unless the authorization is terminated or revoked sooner.  Performed at [facility], [address] [place], [place] [address]     Radiology Studies: No results found.    Norell Brisbin T. Amri Lien [facility]  If 7PM-7AM, please contact night-coverage [email or web address] 02/04/2021, 8:29 PM

## 2021-02-05 DIAGNOSIS — E860 Dehydration: Secondary | ICD-10-CM | POA: Diagnosis not present

## 2021-02-05 DIAGNOSIS — Z515 Encounter for palliative care: Secondary | ICD-10-CM | POA: Diagnosis not present

## 2021-02-05 DIAGNOSIS — E870 Hyperosmolality and hypernatremia: Secondary | ICD-10-CM | POA: Diagnosis not present

## 2021-02-05 DIAGNOSIS — F0391 Unspecified dementia with behavioral disturbance: Secondary | ICD-10-CM | POA: Diagnosis not present

## 2021-02-05 LAB — CULTURE, BLOOD (SINGLE): Special Requests: ADEQUATE

## 2021-02-05 MED ORDER — MORPHINE 100MG IN NS 100ML (1MG/ML) PREMIX INFUSION
1.0000 mg/h | INTRAVENOUS | Status: DC
  Administered 2021-02-05: 1 mg/h via INTRAVENOUS
  Filled 2021-02-05 (×2): qty 100

## 2021-02-05 NOTE — Progress Notes (Signed)
Palliative care progress note  Reason for consult: Goals of care/ symptom management/ actively dying  I checked in on Mr. Sagan today.  He is resting comfortably.  Noted to have no increased work of breathing or agitation following administration of dilaudid this morning.  Some periods of apnea noted.   MAR review reveals that he has had three doses of Robinul and four doses of 1mg  of dilaudid in the last 24 hours.  Recommendations: - DNR/DNI.   - Continue comfort measures - Discussed with bedside RN plan to continue with aggressive use of prn medications as needed to maintain comfort. If his needs remain consistent, I would have low threshold to initiate continuous infusion if needed to ensure comfort. - Offer comfort feeds if safe and appropriate: Applesauce Jell-O pudding ice cream consistency foods if patient is awake/alert as per son's request.  He has not been awake enough to consider this. -Residential hospice was discussed with family by my partner, but it was then determined that he is not stable enough for transport.  - Anticipate hospital death.  Thank you for the consult.  Total time: 20 minutes Greater than 50%  of this time was spent counseling and coordinating care related to the above assessment and plan.  , MD [facility] Palliative Medicine Team [phone]

## 2021-02-05 NOTE — Progress Notes (Signed)
PROGRESS NOTE  Johnathan Combs [id number] DOB: 1941/11/20   PCP: Patient, No Pcp Per  Patient is from: SNF  DOA: 03-02-2021 LOS: 2  Chief complaints: Found down on the floor  Brief Narrative / Interim history: 80 year old M with PMH of dementia, nonverbal, nonambulatory, CAD s/p BMS in 2012, HTN, recent hospitalization at [facility] for MSSA bacteremia with possible endocarditis and GI bleed brought to ED after found down on the floor for unknown period of time.  He had poor p.o. intake.  He was encephalopathic with severe hypernatremia and dehydration.  He was admitted for end-of-life care.   Subjective: Seen and examined earlier this morning and later this afternoon. Has increased work of breathing despite increased IV Dilaudid earlier in the morning although he has not been getting this as frequent as possible  Objective: Vitals:   2021-03-02 2300 02/03/21 0015 02/03/21 0044 02/04/21 1139  BP: (!) 157/85 106/74  (!) 138/91  Pulse: 81 62    Resp: 19 14  (!) 7  Temp:    97.9 F (36.6 C)  TempSrc:    Oral  SpO2: 99% 100%  94%  Weight:   59.4 kg     Intake/Output Summary (Last 24 hours) at 02/05/2021 1833 Last data filed at 02/05/2021 0458 Gross per 24 hour  Intake -  Output 600 ml  Net -600 ml   Filed Weights   02/03/21 0044  Weight: 59.4 kg    Examination: GENERAL: Frail and chronically ill-appearing. RESP: Notable increased work of breathing. CVS: Heart sounds normal.  MSK/EXT: Significant muscle mass loss. NEURO: Somnolent. PSYCH: Calm. No distress or agitation.  Procedures:  None  Microbiology summarized: COVID-19 PCR nonreactive. Blood culture with staph species but one venipuncture.  Assessment & Plan: End-of-life care/comfort measures only/DNR/DNI -Continue full comfort pathway-started morphine infusion. RN to titrate based on symptoms. -Appreciate help by palliative medicine -Anticipate in hospital death.  Abnormal blood culture Acute metabolic  encephalopathy due to severe hypernatremia and dehydration Severe hypernatremia due to dehydration in the setting of poor p.o. intake Dementia with aggressive behavior Debility/nonambulatory status/functional quadriplegia History of CAD s/p BMS in 2012 Essential hypertension Hypercalcemia Lactic acidosis History of MSSA bacteremia/possible endocarditis History of GI bleed     There is no height or weight on file to calculate BMI.         DVT prophylaxis:  None.  Full comfort care.  Code Status: DNR/DNI. Family Communication: Patient's Charity fundraiser. Level of care: Med-Surg Status is: Inpatient  Remains inpatient appropriate because:Unsafe d/c plan   Dispo: The patient is from: SNF              Anticipated d/c is to:  Anticipate in hospital death              Anticipated d/c date is: 1 day              Patient currently is not medically stable to d/c.   Difficult to place patient No       Consultants:  Palliative medicine   Sch Meds:  Scheduled Meds: . chlorhexidine  15 mL Mouth Rinse BID  . mouth rinse  15 mL Mouth Rinse q12n4p   Continuous Infusions: . morphine 1 mg/hr (02/05/21 1735)   PRN Meds:.acetaminophen **OR** acetaminophen, antiseptic oral rinse, glycopyrrolate **OR** glycopyrrolate **OR** glycopyrrolate, haloperidol **OR** haloperidol **OR** haloperidol lactate, LORazepam **OR** LORazepam **OR** LORazepam, morphine CONCENTRATE **OR** morphine CONCENTRATE, ondansetron **OR** ondansetron (ZOFRAN) IV, polyvinyl alcohol  Antimicrobials: Anti-infectives (From admission, onward)   Start  Dose/Rate Route Frequency Ordered Stop   02/01/2021 1900  cefTRIAXone (ROCEPHIN) 1 g in sodium chloride 0.9 % 100 mL IVPB        1 g 200 mL/hr over 30 Minutes Intravenous  Once 02/04/2021 1849 02/10/2021 2050       I have personally reviewed the following labs and images: CBC: Recent Labs  Lab 01/16/2021 1409  WBC 5.4  NEUTROABS 2.5  HGB 12.2*  HCT 39.8  MCV 106.1*  PLT  211   BMP &GFR Recent Labs  Lab 01/20/2021 1409  NA 165*  K 3.8  CL 126*  CO2 30  GLUCOSE 118*  BUN 47*  CREATININE 1.23  CALCIUM 10.6*   CrCl cannot be calculated (Unknown ideal weight.). Liver & Pancreas: Recent Labs  Lab 01/19/2021 1409  AST 29  ALT 20  ALKPHOS 124  BILITOT 1.4*  PROT 6.9  ALBUMIN 3.7   No results for input(s): LIPASE, AMYLASE in the last 168 hours. No results for input(s): AMMONIA in the last 168 hours. Diabetic: No results for input(s): HGBA1C in the last 72 hours. No results for input(s): GLUCAP in the last 168 hours. Cardiac Enzymes: No results for input(s): CKTOTAL, CKMB, CKMBINDEX, TROPONINI in the last 168 hours. No results for input(s): PROBNP in the last 8760 hours. Coagulation Profile: Recent Labs  Lab 02/01/2021 1409  INR 1.3*   Thyroid Function Tests: No results for input(s): TSH, T4TOTAL, FREET4, T3FREE, THYROIDAB in the last 72 hours. Lipid Profile: No results for input(s): CHOL, HDL, LDLCALC, TRIG, CHOLHDL, LDLDIRECT in the last 72 hours. Anemia Panel: No results for input(s): VITAMINB12, FOLATE, FERRITIN, TIBC, IRON, RETICCTPCT in the last 72 hours. Urine analysis:    Component Value Date/Time   COLORURINE YELLOW 02/03/2021 1526   APPEARANCEUR TURBID (A) 01/29/2021 1526   LABSPEC 1.021 02/10/2021 1526   PHURINE 5.0 01/17/2021 1526   GLUCOSEU NEGATIVE 01/21/2021 1526   HGBUR SMALL (A) 01/29/2021 1526   BILIRUBINUR NEGATIVE 02/04/2021 1526   KETONESUR NEGATIVE 01/23/2021 1526   PROTEINUR 100 (A) 01/24/2021 1526   NITRITE NEGATIVE 01/17/2021 1526   LEUKOCYTESUR MODERATE (A) 02/11/2021 1526   Sepsis Labs: Invalid input(s): PROCALCITONIN, LACTICIDVEN  Microbiology: Recent Results (from the past 240 hour(s))  Blood culture (routine single)     Status: Abnormal   Collection Time: 01/23/2021  2:09 PM   Specimen: BLOOD  Result Value Ref Range Status   Specimen Description   Final    BLOOD BLOOD LEFT FOREARM Performed at  [facility], [address] [place], [place] [address]    Special Requests   Final    BOTTLES DRAWN AEROBIC AND ANAEROBIC Blood Culture adequate volume Performed at [facility], [address] [place], [place] [address]    Culture  Setup Time   Final    GRAM POSITIVE COCCI IN CLUSTERS IN BOTH AEROBIC AND ANAEROBIC BOTTLES CRITICAL RESULT CALLED TO, READ BACK BY AND VERIFIED WITH: SCOTT CHRISTY PHARMD @1139  02/03/21 EB    Culture (A)  Final    STAPHYLOCOCCUS HAEMOLYTICUS THE SIGNIFICANCE OF ISOLATING THIS ORGANISM FROM A SINGLE VENIPUNCTURE CANNOT BE PREDICTED WITHOUT FURTHER CLINICAL AND CULTURE CORRELATION. SUSCEPTIBILITIES AVAILABLE ONLY ON REQUEST. Performed at [facility], [address] [place], [place] [address]    Report Status 02/05/2021 FINAL  Final  Blood Culture ID Panel (Reflexed)     Status: Abnormal   Collection Time: 02/06/2021  2:09 PM  Result Value Ref Range Status   Enterococcus faecalis  NOT DETECTED NOT DETECTED Final   Enterococcus Faecium NOT DETECTED NOT DETECTED Final   Listeria monocytogenes NOT DETECTED NOT DETECTED Final   Staphylococcus species DETECTED (A) NOT DETECTED Final    Comment: CRITICAL RESULT CALLED TO, READ BACK BY AND VERIFIED WITH: SCOTT CHRISTY PHARMD @1139  02/03/21 EB    Staphylococcus aureus (BCID) NOT DETECTED NOT DETECTED Final   Staphylococcus epidermidis NOT DETECTED NOT DETECTED Final   Staphylococcus lugdunensis NOT DETECTED NOT DETECTED Final   Streptococcus species NOT DETECTED NOT DETECTED Final   Streptococcus agalactiae NOT DETECTED NOT DETECTED Final   Streptococcus pneumoniae NOT DETECTED NOT DETECTED Final   Streptococcus pyogenes NOT DETECTED NOT DETECTED Final   A.calcoaceticus-baumannii NOT DETECTED NOT DETECTED Final   Bacteroides fragilis NOT DETECTED NOT DETECTED Final   Enterobacterales NOT DETECTED NOT DETECTED Final   Enterobacter cloacae complex NOT DETECTED NOT  DETECTED Final   Escherichia coli NOT DETECTED NOT DETECTED Final   Klebsiella aerogenes NOT DETECTED NOT DETECTED Final   Klebsiella oxytoca NOT DETECTED NOT DETECTED Final   Klebsiella pneumoniae NOT DETECTED NOT DETECTED Final   Proteus species NOT DETECTED NOT DETECTED Final   Salmonella species NOT DETECTED NOT DETECTED Final   Serratia marcescens NOT DETECTED NOT DETECTED Final   Haemophilus influenzae NOT DETECTED NOT DETECTED Final   Neisseria meningitidis NOT DETECTED NOT DETECTED Final   Pseudomonas aeruginosa NOT DETECTED NOT DETECTED Final   Stenotrophomonas maltophilia NOT DETECTED NOT DETECTED Final   Candida albicans NOT DETECTED NOT DETECTED Final   Candida auris NOT DETECTED NOT DETECTED Final   Candida glabrata NOT DETECTED NOT DETECTED Final   Candida krusei NOT DETECTED NOT DETECTED Final   Candida parapsilosis NOT DETECTED NOT DETECTED Final   Candida tropicalis NOT DETECTED NOT DETECTED Final   Cryptococcus neoformans/gattii NOT DETECTED NOT DETECTED Final    Comment: Performed at [facility], [address], [place], [place]  Urine culture     Status: Abnormal   Collection Time: 02/04/2021  3:26 PM   Specimen: Urine, Catheterized  Result Value Ref Range Status   Specimen Description   Final    URINE, CATHETERIZED Performed at [facility], [address], [place], [place]    Special Requests   Final    NONE Performed at [facility], [address], [place], [place]    Culture (A)  Final    <10,000 COLONIES/mL INSIGNIFICANT GROWTH Performed at [facility], [address] [place], [place]    Report Status 02/04/2021 FINAL  Final  SARS CORONAVIRUS 2 (TAT 6-24 HRS) Nasopharyngeal Nasopharyngeal Swab     Status: None   Collection Time: 02/03/2021  8:27 PM   Specimen: Nasopharyngeal Swab  Result Value Ref Range Status   SARS Coronavirus 2 NEGATIVE NEGATIVE Final     Comment: (NOTE) SARS-CoV-2 target nucleic acids are NOT DETECTED.  The SARS-CoV-2 RNA is generally detectable in upper and lower respiratory specimens during the acute phase of infection. Negative results do not preclude SARS-CoV-2 infection, do not rule out co-infections with other pathogens, and should not be used as the sole basis for treatment or other patient management decisions. Negative results must be combined with clinical observations, patient history, and epidemiological information. The expected result is Negative.  Fact Sheet for Patients: 02/04/21  Fact Sheet for Healthcare Providers: HairSlick.no  This test is not yet approved or cleared by the quierodirigir.com FDA and  has  been authorized for detection and/or diagnosis of SARS-CoV-2 by FDA under an Emergency Use Authorization (EUA). This EUA will remain  in effect (meaning this test can be used) for the duration of the COVID-19 declaration under Se ction 564(b)(1) of the Act, 21 U.S.C. section 360bbb-3(b)(1), unless the authorization is terminated or revoked sooner.  Performed at [facility], [address], [place], [place] [address]     Radiology Studies: No results found.    Jarvin Ogren T. Teia Freitas [facility]  If 7PM-7AM, please contact night-coverage [email or web address] 02/05/2021, 6:33 PM

## 2021-02-06 DIAGNOSIS — E870 Hyperosmolality and hypernatremia: Secondary | ICD-10-CM | POA: Diagnosis not present

## 2021-02-06 DIAGNOSIS — E860 Dehydration: Secondary | ICD-10-CM | POA: Diagnosis not present

## 2021-02-06 DIAGNOSIS — F0391 Unspecified dementia with behavioral disturbance: Secondary | ICD-10-CM | POA: Diagnosis not present

## 2021-02-06 DIAGNOSIS — Z515 Encounter for palliative care: Secondary | ICD-10-CM | POA: Diagnosis not present

## 2021-02-06 DIAGNOSIS — I10 Essential (primary) hypertension: Secondary | ICD-10-CM

## 2021-02-06 MED ORDER — MORPHINE BOLUS VIA INFUSION
2.0000 mg | INTRAVENOUS | Status: DC | PRN
  Filled 2021-02-06: qty 2

## 2021-02-06 NOTE — Care Management Important Message (Signed)
Important Message  Patient Details IM Letter given to the Patient. Name: Jaxton Casale MRN: [id number] Date of Birth: Sep 29, 1941   Medicare Important Message Given:  Yes     Caren Macadam 02/06/2021, 10:56 AM

## 2021-02-06 NOTE — Progress Notes (Signed)
Palliative care progress note                                                                                                                                                                                                           Daily Progress Note   Patient Name: Johnathan Combs       Date: 02/06/2021 DOB: 26-Jan-1941  Age: 80 y.o. MRN#: [id number] Attending Physician: Coralie Keens Primary Care Physician: Patient, No Pcp Per Admit Date: 02-15-2021  Reason for Consultation/Follow-up: Terminal Care  Subjective: I saw and examined [name] today.  He was started on morphine infusion and is resting comfortably with no distress noted.  Did not respond to gentle verbal or tactile stimulation.  I called but was unable to reach his son today.  Length of Stay: 3  Current Medications: Scheduled Meds:  . chlorhexidine  15 mL Mouth Rinse BID  . mouth rinse  15 mL Mouth Rinse q12n4p    Continuous Infusions: . morphine 1 mg/hr (02/05/21 1735)    PRN Meds: acetaminophen **OR** acetaminophen, antiseptic oral rinse, glycopyrrolate **OR** glycopyrrolate **OR** glycopyrrolate, haloperidol **OR** haloperidol **OR** haloperidol lactate, LORazepam **OR** LORazepam **OR** LORazepam, morphine, morphine CONCENTRATE **OR** morphine CONCENTRATE, ondansetron **OR** ondansetron (ZOFRAN) IV, polyvinyl alcohol  Physical Exam   General: Somnolent, no distress, periods of apnea noted HEENT: No bruits, no goiter, no JVD Heart: Tachycardic. No murmur appreciated. Lungs: Decreased air movement, rhonchi  abdomen: Soft, nontender, nondistended, positive bowel sounds.  Ext: No significant edema Skin: Warm and dry        Vital Signs: BP 105/76 (BP Location: Left Arm)   Pulse (!) 103   Temp 99.9 F (37.7 C) (Axillary)   Resp 20   Wt 59.4 kg   SpO2 90%  SpO2: SpO2: 90 % O2 Device: O2 Device: Room Air O2 Flow Rate:    Intake/output summary:   Intake/Output Summary (Last 24 hours) at  02/06/2021 1441 Last data filed at 02/05/2021 1800 Gross per 24 hour  Intake 100 ml  Output -  Net 100 ml   LBM: Last BM Date:  (PTA) Baseline Weight: Weight: 59.4 kg Most recent weight: Weight: 59.4 kg       Palliative Assessment/Data:      Patient Active Problem List   Diagnosis Date Noted  . Hypernatremia 02-15-2021  . Dehydration Feb 15, 2021  . Hypercalcemia 02/15/21  . Hypertension   . Dementia with aggressive behavior (HCC)   . CAD (coronary artery disease)     Palliative Care Assessment & Plan   Patient Profile:  80 year old male with significant dementia, CAD, hypertension who presented to ED after being found down at SNF.  Goal moving forward is full comfort.  Recommendations/Plan: - DNR/DNI.   - Continue comfort measures -Residential hospice was discussed with family by my partner, but it was then determined that he is not stable enough for transport.  - Anticipate hospital death. - I called his son but was unable to reach him today.  I left a voicemail with my callback number if he has any questions or concerns.  Goals of Care and Additional Recommendations:  Limitations on Scope of Treatment: Full Comfort Care  Code Status:    Code Status Orders  (From admission, onward)         Start     Ordered   02-27-2021 2204  Do not attempt resuscitation (DNR)  Continuous       Question Answer Comment  In the event of cardiac or respiratory ARREST Do not call a "code blue"   In the event of cardiac or respiratory ARREST Do not perform Intubation, CPR, defibrillation or ACLS   In the event of cardiac or respiratory ARREST Use medication by any route, position, wound care, and other measures to relive pain and suffering. May use oxygen, suction and manual treatment of airway obstruction as needed for comfort.      27-Feb-2021 2205        Code Status History    This patient has a current code status but no historical code status.   Advance Care Planning  Activity       Prognosis:   Hours - Days  Discharge Planning:  Anticipated Hospital Death  Care plan was discussed with bedside RN  Thank you for allowing the Palliative Medicine Team to assist in the care of this patient.   Time In: 1230 Time Out: 1250 Total Time 20 Prolonged Time Billed No      Greater than 50%  of this time was spent counseling and coordinating care related to the above assessment and plan.  Romie Minus, MD  Please contact Palliative Medicine Team phone at [phone] for questions and concerns.

## 2021-02-06 NOTE — Progress Notes (Signed)
PROGRESS NOTE    Johnathan Combs  GYF:[id number] DOB: October 06, 1941 DOA: February 11, 2021 PCP: Patient, No Pcp Per    Brief Narrative:  Johnathan Combs was admitted to the hospital with a working diagnosis of severe hypernatremia due to dehydration in the setting of aggressive/progressive dementia. Now on comfort measures.  80 year old male with past medical history of of advanced and progressive dementia, patient nonverbal and nonambulatory.  He also has history of coronary artery disease and hypertension.  He is a resident at the nursing facility since a recent prolonged hospitalization at [facility]. (11/21/2020-01/02/2021, treated for incarcerated hernia, status post laparoscopic repair, hospitalization complicated by bacteremia, MSSA and gastrointestinal bleed). For the last 7 days his his oral intake had a significant decline, refusing to eat.  On the day of admission he he was found down and then transferred to the hospital. On admission his son was contacted, he was explained about poor prognosis and critical condition, he requested to continue care under comfort measures, patient DNR. On his initial physical examination blood pressure 124/76, heart rate 70, respirate 15, oxygen saturation 91%, dry mucous membranes, lungs clear to auscultation bilaterally, heart S1-S2, present rhythmic, soft abdomen, no lower extremity edema, patient nonverbal, not interactive, moving all 4 extremities spontaneously.  Sodium 165, potassium 3.8, chloride 126, BUN 30, glucose 118, BUN 47, creatinine 1.23, white count 5.4, hemoglobin 12.2, hematocrit 39.8, platelets 211. SARS COVID-19 negative. Urinalysis specific gravity 1.021, > 50 red cells, > 50 white cells. Chest radiograph no infiltrates CT head/neck no acute changes. EKG 64 bpm, normal axis, normal intervals, sinus rhythm, no ST segment or T wave changes.  Patient was placed on comfort measures, including intravenous analgesics and  sedatives.  Initially placed on bolus analgesics, with not adequate control, now transitioned to continuous infusion of morphine.  Assessment & Plan:   Principal Problem:   Hypernatremia Active Problems:   Hypertension   Dementia with aggressive behavior (HCC)   CAD (coronary artery disease)   Dehydration   Hypercalcemia   1. Acute metabolic encephalopathy du to severe hypernatremia in the setting of progressive dementia. Patient now on morphine drip 1 mg per hr. He is sedated and not responsive.  Continue with comfort measures, death is imminent.   No indication for IV fluids or further blood work, patient with poor prognosis due to advanced dementia.   2. CAD. No chest pain.  3. HTN. Off antihypertensive medications,   Imminent death.    Status is: Inpatient  Remains inpatient appropriate because:IV treatments appropriate due to intensity of illness or inability to take PO   Dispo: The patient is from: Home              Anticipated d/c is to: Home              Anticipated d/c date is: 1 day              Patient currently is not medically stable to d/c.   Difficult to place patient No   DVT prophylaxis: Not indicated palliative care  Code Status:    DNR   Family Communication:  No family at the bedside      Consultants:   Palliative care    Subjective: Patient is sedated on morphine drip 1 mg per h, not responsive, not in apparent pain or dyspnea,   Objective: Vitals:   02/03/21 0044 02/04/21 1139 02/05/21 2031 02/06/21 0504  BP:  (!) 138/91 113/65 105/76  Pulse:   (!) 111 (!) 103  Resp:  (!) 7 20 20   Temp:  97.9 F (36.6 C) 99.4 F (37.4 C) 99.9 F (37.7 C)  TempSrc:  Oral Axillary Axillary  SpO2:  94% (!) 85% 90%  Weight: 59.4 kg       Intake/Output Summary (Last 24 hours) at 02/06/2021 1056 Last data filed at 02/05/2021 1800 Gross per 24 hour  Intake 100 ml  Output --  Net 100 ml   Filed Weights   02/03/21 0044  Weight: 59.4 kg     Examination:   General: deconditioned and ill looking appearing  Neurology: sedated, not responsive to voice or light touch,.   E ENT: positive  pallor, no icterus, oral mucosa moist Cardiovascular: No JVD. S1-S2 present, rhythmic, no gallops, rubs, or murmurs. No lower extremity edema. Pulmonary: positive breath sounds bilaterally, positive rhonchi.  Gastrointestinal. Abdomen soft Skin. No rashes Musculoskeletal: no joint deformities     Data Reviewed: I have personally reviewed following labs and imaging studies  CBC: Recent Labs  Lab 02/10/2021 1409  WBC 5.4  NEUTROABS 2.5  HGB 12.2*  HCT 39.8  MCV 106.1*  PLT 211   Basic Metabolic Panel: Recent Labs  Lab 01/30/2021 1409  NA 165*  K 3.8  CL 126*  CO2 30  GLUCOSE 118*  BUN 47*  CREATININE 1.23  CALCIUM 10.6*   GFR: CrCl cannot be calculated (Unknown ideal weight.). Liver Function Tests: Recent Labs  Lab 01/19/2021 1409  AST 29  ALT 20  ALKPHOS 124  BILITOT 1.4*  PROT 6.9  ALBUMIN 3.7   No results for input(s): LIPASE, AMYLASE in the last 168 hours. No results for input(s): AMMONIA in the last 168 hours. Coagulation Profile: Recent Labs  Lab 01/18/2021 1409  INR 1.3*   Cardiac Enzymes: No results for input(s): CKTOTAL, CKMB, CKMBINDEX, TROPONINI in the last 168 hours. BNP (last 3 results) No results for input(s): PROBNP in the last 8760 hours. HbA1C: No results for input(s): HGBA1C in the last 72 hours. CBG: No results for input(s): GLUCAP in the last 168 hours. Lipid Profile: No results for input(s): CHOL, HDL, LDLCALC, TRIG, CHOLHDL, LDLDIRECT in the last 72 hours. Thyroid Function Tests: No results for input(s): TSH, T4TOTAL, FREET4, T3FREE, THYROIDAB in the last 72 hours. Anemia Panel: No results for input(s): VITAMINB12, FOLATE, FERRITIN, TIBC, IRON, RETICCTPCT in the last 72 hours.    Radiology Studies: I have reviewed all of the imaging during this hospital visit  personally     Scheduled Meds: . chlorhexidine  15 mL Mouth Rinse BID  . mouth rinse  15 mL Mouth Rinse q12n4p   Continuous Infusions: . morphine 1 mg/hr (02/05/21 1735)     LOS: 3 days        Zyara Riling 01/31/2021, MD

## 2021-02-07 DIAGNOSIS — F0391 Unspecified dementia with behavioral disturbance: Secondary | ICD-10-CM | POA: Diagnosis not present

## 2021-02-07 DIAGNOSIS — E860 Dehydration: Secondary | ICD-10-CM | POA: Diagnosis not present

## 2021-02-07 DIAGNOSIS — Z515 Encounter for palliative care: Secondary | ICD-10-CM | POA: Diagnosis not present

## 2021-02-07 DIAGNOSIS — E870 Hyperosmolality and hypernatremia: Secondary | ICD-10-CM | POA: Diagnosis not present

## 2021-02-12 NOTE — Progress Notes (Signed)
PROGRESS NOTE    Johnathan Combs  OJJ:[id number] DOB: 02/22/1941 DOA: 02-17-21 PCP: Patient, No Pcp Per    Brief Narrative:  Mr.Philbert was admitted to the hospital with a working diagnosis of severe hypernatremia due to dehydration in the setting of aggressive/progressive dementia. Now on comfort measures.  80 year old male with past medical history of of advanced and progressive dementia, patient nonverbal and nonambulatory.  He also has history of coronary artery disease and hypertension.  He is a resident at the nursing facility since a recent prolonged hospitalization at [facility]. (11/21/2020-01/02/2021, treated for incarcerated hernia, status post laparoscopic repair, hospitalization complicated by bacteremia, MSSA and gastrointestinal bleed). For the last 7 days his his oral intake had a significant decline, refusing to eat.  On the day of admission he he was found down and then transferred to the hospital. On admission his son was contacted, he was explained about poor prognosis and critical condition, he requested to continue care under comfort measures, patient DNR. On his initial physical examination blood pressure 124/76, heart rate 70, respirate 15, oxygen saturation 91%, dry mucous membranes, lungs clear to auscultation bilaterally, heart S1-S2, present rhythmic, soft abdomen, no lower extremity edema, patient nonverbal, not interactive, moving all 4 extremities spontaneously.  Sodium 165, potassium 3.8, chloride 126, BUN 30, glucose 118, BUN 47, creatinine 1.23, white count 5.4, hemoglobin 12.2, hematocrit 39.8, platelets 211. SARS COVID-19 negative. Urinalysis specific gravity 1.021, > 50 red cells, > 50 white cells. Chest radiograph no infiltrates CT head/neck no acute changes. EKG 64 bpm, normal axis, normal intervals, sinus rhythm, no ST segment or T wave changes.  Patient was placed on comfort measures, including intravenous analgesics and  sedatives.  Initially placed on bolus analgesics, with not adequate control, now transitioned to continuous infusion of morphine.   Assessment & Plan:   Principal Problem:   Hypernatremia Active Problems:   Hypertension   Dementia with aggressive behavior (HCC)   CAD (coronary artery disease)   Dehydration   Hypercalcemia    1. Acute metabolic encephalopathy du to severe hypernatremia in the setting of progressive dementia. Continue comfort measures, on IV morphine drip.   No indication for IV fluids or further blood work, patient with poor prognosis due to advanced dementia.   2. CAD. No chest pain.  3. HTN. Off antihypertensive medications,  Imminent death, patient not stable for transportation to hospice facility.   Status is: Inpatient  Remains inpatient appropriate because:IV treatments appropriate due to intensity of illness or inability to take PO   Dispo: The patient is from: Home              Anticipated d/c is to: imminent death              Patient currently is not medically stable to d/c.   Difficult to place patient No   DVT prophylaxis: Na   Code Status:    DNR   Family Communication:  No family at the bedside     Consultants:   Palliative care     Subjective: Patient sedated on morphine drip, slow respiration, not in apparent pain or dyspnea,   Objective: Vitals:   02/04/21 1139 02/05/21 2031 02/06/21 0504 01/23/2021 0542  BP: (!) 138/91 113/65 105/76 (!) 93/58  Pulse:  (!) 111 (!) 103 (!) 109  Resp: (!) 7 20 20 14   Temp: 97.9 F (36.6 C) 99.4 F (37.4 C) 99.9 F (37.7 C) 99.6 F (37.6 C)  TempSrc: Oral Axillary Axillary Oral  SpO2: 94% (!) 85% 90% (!) 78%  Weight:        Intake/Output Summary (Last 24 hours) at 2021-02-17 1214 Last data filed at 17-Feb-2021 0546 Gross per 24 hour  Intake --  Output 50 ml  Net -50 ml   Filed Weights   02/03/21 0044  Weight: 59.4 kg    Examination:   General: deconditioned, agonal  breathing.  Neurology: sedated not in apparent pain or dyspnea.  E ENT: positive pallor, no icterus, oral mucosa dry.  Cardiovascular: No JVD. S1-S2 present, rhythmic, no gallops, rubs, or murmurs. No lower extremity edema. Pulmonary: positive slow and shallow breath sounds bilaterally, Gastrointestinal. Abdomen soft Skin. No rashes Musculoskeletal: no joint deformities     Data Reviewed: I have personally reviewed following labs and imaging studies  CBC: Recent Labs  Lab 01/25/2021 1409  WBC 5.4  NEUTROABS 2.5  HGB 12.2*  HCT 39.8  MCV 106.1*  PLT 211   Basic Metabolic Panel: Recent Labs  Lab 01/18/2021 1409  NA 165*  K 3.8  CL 126*  CO2 30  GLUCOSE 118*  BUN 47*  CREATININE 1.23  CALCIUM 10.6*   GFR: CrCl cannot be calculated (Unknown ideal weight.). Liver Function Tests: Recent Labs  Lab 02/06/2021 1409  AST 29  ALT 20  ALKPHOS 124  BILITOT 1.4*  PROT 6.9  ALBUMIN 3.7   No results for input(s): LIPASE, AMYLASE in the last 168 hours. No results for input(s): AMMONIA in the last 168 hours. Coagulation Profile: Recent Labs  Lab 02/11/2021 1409  INR 1.3*   Cardiac Enzymes: No results for input(s): CKTOTAL, CKMB, CKMBINDEX, TROPONINI in the last 168 hours. BNP (last 3 results) No results for input(s): PROBNP in the last 8760 hours. HbA1C: No results for input(s): HGBA1C in the last 72 hours. CBG: No results for input(s): GLUCAP in the last 168 hours. Lipid Profile: No results for input(s): CHOL, HDL, LDLCALC, TRIG, CHOLHDL, LDLDIRECT in the last 72 hours. Thyroid Function Tests: No results for input(s): TSH, T4TOTAL, FREET4, T3FREE, THYROIDAB in the last 72 hours. Anemia Panel: No results for input(s): VITAMINB12, FOLATE, FERRITIN, TIBC, IRON, RETICCTPCT in the last 72 hours.    Radiology Studies: I have reviewed all of the imaging during this hospital visit personally     Scheduled Meds: . chlorhexidine  15 mL Mouth Rinse BID  . mouth rinse   15 mL Mouth Rinse q12n4p   Continuous Infusions: . morphine 1 mg/hr (02/05/21 1735)     LOS: 4 days        Kenishia Plack Annett Gula, MD

## 2021-02-12 NOTE — Death Summary Note (Signed)
Death Summary  Johnathan Combs XBL:[id number] DOB: Oct 01, 1941 DOA: 02/18/21  PCP: Patient, No Pcp Per  Admit date: 02-18-2021 Date of Death: 23-Feb-2021 Time of Death: 13:34 Notification: Patient, No Pcp Per notified of death of 2021-02-23   History of present illness:  Johnathan Combs is a 80 y.o. male with a history of advanced and progressive dementia, patient nonverbal and nonambulatory. He also had history of coronary artery disease and hypertension. He was a resident at a nursing facility since a recent prolonged hospitalization at [facility]. (11/21/2020-01/02/2021,treated for incarcerated hernia, status post laparoscopic repair, hospitalization complicated by bacteremia, MSSA and gastrointestinal bleed). Johnathan Combs presented with complaint of altered mental status.  Johnathan Combs did not improve after initial medical therapy in the ED.   For the last 7 days his his oral intake had a significant decline, refusing to eat. On the day of admission he he was found down and then transferred to the hospital. On admission his son was contacted, he was explained about poor prognosis and critical condition, he requested to continue care under comfort measures, patient DNR. On his initial physical examination blood pressure 124/76, heart rate 70, respirate 15, oxygen saturation 91%, dry mucous membranes,lungs clear to auscultation bilaterally, heart S1-S2, present rhythmic, soft abdomen, no lower extremity edema, patient nonverbal, not interactive, moving all 4 extremities spontaneously.  Sodium 165, potassium 3.8, chloride 126, BUN 30, glucose 118, BUN 47, creatinine 1.23, white count 5.4, hemoglobin 12.2, hematocrit 39.8, platelets 211. SARS COVID-19 negative. Urinalysis specific gravity 1.021,>50 red cells, >50 white cells. Chest radiograph no infiltrates CT head/neck no acute changes. EKG 64 bpm, normal axis, normal intervals, sinus rhythm, no ST segment or T wave  changes.  His son was contacted, he was explained about clinical condition and poor prognosis, decision was made to continue management under comfort care.  His CODE STATUS was changed to DNR.  Patient was placed on comfort measures, including intravenous analgesics and sedatives.  Initially placed on bolus analgesics, with not adequate control, then transitioned to continuous infusion of morphine.  Final Diagnoses:   1. Acute metabolic encephalopathy du to severe hypernatremia in the setting of progressive dementia. 2. CAD.  3. HTN.    The results of significant diagnostics from this hospitalization (including imaging, microbiology, ancillary and laboratory) are listed below for reference.    Significant Diagnostic Studies: DG Chest 1 View  Result Date: 2021/02/18 CLINICAL DATA:  Pain status post fall EXAM: CHEST  1 VIEW COMPARISON:  None. 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 The lung volumes are low. There is no pneumothorax. No definite acute displaced fracture. There are end-stage degenerative changes of the right glenohumeral joint. Atelectasis is noted at the lung bases. Aortic calcifications are noted. The heart size is unremarkable. IMPRESSION: 1. No acute cardiopulmonary process. 2. 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 3. End-stage degenerative changes of the right glenohumeral joint. Electronically Signed   By: Katherine Mantle M.D.   On: 02/18/21 15:17   DG Pelvis 1-2 Views  Result Date: 2021/02/18 CLINICAL DATA:  Pain status post fall EXAM: PELVIS - 1-2 VIEW COMPARISON:  None. FINDINGS: There is osteopenia which limits detection of nondisplaced fractures. There is no definite acute displaced fracture or dislocation. Evaluation of the parasymphyseal pubic bones is limited by overlapping dense stool within the rectum. There is a large amount of stool in the rectum. There are  calcifications projecting over the lower left mid  abdomen. Degenerative changes are noted of the bilateral hips and lower lumbar spine. IMPRESSION: 1. No definite acute displaced fracture or dislocation. Evaluation of the parasymphyseal pubic bones is limited by overlapping dense stool within the rectum. 2. Large amount of stool in the rectum. 3. Degenerative changes of the bilateral hips and lower lumbar spine. 4. Calcifications project over the left lower abdomen. These are of unknown clinical significance and may represent nephroliths. Electronically Signed   By: Katherine Mantle M.D.   On: 02/08/21 15:17   DG Shoulder Right  Result Date: 02-08-2021 CLINICAL DATA:  Pain EXAM: RIGHT SHOULDER - 2+ VIEW COMPARISON:  None. FINDINGS: There are end-stage degenerative changes of the right glenohumeral joint. There is no evidence for an acute displaced fracture or dislocation. There is osteopenia which limits detection of nondisplaced fractures. IMPRESSION: 1. No acute displaced fracture or dislocation. 2. End-stage degenerative changes of the right glenohumeral joint. Electronically Signed   By: Katherine Mantle M.D.   On: 02-08-21 15:20   DG Elbow Complete Right  Result Date: 02/08/21 CLINICAL DATA:  Pain status post fall EXAM: RIGHT ELBOW - COMPLETE 3+ VIEW COMPARISON:  None. FINDINGS: There is no evidence of fracture, dislocation, or joint effusion. There is no evidence of arthropathy or other focal bone abnormality. Soft tissues are unremarkable. IMPRESSION: Negative. Electronically Signed   By: Katherine Mantle M.D.   On: 2021/02/08 15:18   CT Head Wo Contrast  Result Date: Feb 08, 2021 CLINICAL DATA:  Found on floor at nursing home, neck trauma, head trauma EXAM: CT HEAD WITHOUT CONTRAST CT CERVICAL SPINE WITHOUT CONTRAST TECHNIQUE: Multidetector CT imaging of the head and cervical spine was performed following the standard protocol without intravenous contrast. Multiplanar CT image  reconstructions of the cervical spine were also generated. COMPARISON:  Two in 22 FINDINGS: CT HEAD FINDINGS Brain: Generalized atrophy. Normal ventricular morphology. No midline shift or mass effect. Small vessel chronic ischemic changes of deep cerebral white matter. No intracranial hemorrhage, mass lesion, evidence of acute infarction, or extra-axial fluid collection. Vascular: No hyperdense vessels. Atherosclerotic calcification at vertebral arteries. Skull: Demineralized but intact Sinuses/Orbits: Clear Other: N/A CT CERVICAL SPINE FINDINGS Alignment: Minimal anterolisthesis C4-C5. Remaining alignments normal. Skull base and vertebrae: Multilevel facet degenerative changes. Multilevel disc space narrowing with few scattered endplate spurs. Vertebral body heights maintained. Skull base intact. No fracture, subluxation, or bone destruction. Soft tissues and spinal canal: Prevertebral soft tissues normal thickness. Significant atherosclerotic calcification in the carotid systems. Disc levels:  No specific abnormality Upper chest: Lung apices clear Other: N/A IMPRESSION: Atrophy with small vessel chronic ischemic changes of deep cerebral white matter. No acute intracranial abnormalities. 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 No acute cervical spine abnormalities. Electronically Signed   By: Ulyses Southward M.D.   On: 2021/02/08 15:35   CT Head Wo Contrast  Result Date: 01/15/2021 CLINICAL DATA:  Fall from standing with eye laceration EXAM: CT HEAD WITHOUT CONTRAST CT CERVICAL SPINE WITHOUT CONTRAST TECHNIQUE: Multidetector CT imaging of the head and cervical spine was performed following the standard protocol without intravenous contrast. Multiplanar CT image reconstructions of the cervical spine were also generated. COMPARISON:  None. FINDINGS: CT HEAD FINDINGS Brain: No evidence of swelling, acute infarction, hemorrhage, hydrocephalus, extra-axial collection or mass  lesion/mass effect. Chronic small vessel ischemia which is confluent in the deep white matter. 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 Vascular: No hyperdense vessel or unexpected calcification. Skull: No acute finding. Benign sclerosis at the  sphenoid body from fibro-osseous lesion or arrested sinus aeration. Sinuses/Orbits: No evidence of injury. Bilateral cataract resection. CT CERVICAL SPINE FINDINGS Alignment: Normal. Skull base and vertebrae: No acute fracture. No primary bone lesion or focal pathologic process. Soft tissues and spinal canal: No prevertebral fluid or swelling. No visible canal hematoma. Disc levels: Multilevel degenerative disc narrowing and facet spurring. Upper chest: Negative IMPRESSION: 1. No evidence of acute intracranial or cervical spine injury. 2. Small remote right parietal infarct. Electronically Signed   By: Marnee SpringJonathon  Watts M.D.   On: 01/15/2021 07:21   CT Cervical Spine Wo Contrast  Result Date: 01/21/2021 CLINICAL DATA:  Found on floor at nursing home, neck trauma, head trauma EXAM: CT HEAD WITHOUT CONTRAST CT CERVICAL SPINE WITHOUT CONTRAST TECHNIQUE: Multidetector CT imaging of the head and cervical spine was performed following the standard protocol without intravenous contrast. Multiplanar CT image reconstructions of the cervical spine were also generated. COMPARISON:  Two in 22 FINDINGS: CT HEAD FINDINGS Brain: Generalized atrophy. Normal ventricular morphology. No midline shift or mass effect. Small vessel chronic ischemic changes of deep cerebral white matter. No intracranial hemorrhage, mass lesion, evidence of acute infarction, or extra-axial fluid collection. Vascular: No hyperdense vessels. Atherosclerotic calcification at vertebral arteries. Skull: Demineralized but intact Sinuses/Orbits: Clear Other: N/A CT CERVICAL SPINE FINDINGS Alignment: Minimal anterolisthesis C4-C5. Remaining alignments normal. Skull base and vertebrae: Multilevel facet  degenerative changes. Multilevel disc space narrowing with few scattered endplate spurs. Vertebral body heights maintained. Skull base intact. No fracture, subluxation, or bone destruction. Soft tissues and spinal canal: Prevertebral soft tissues normal thickness. Significant atherosclerotic calcification in the carotid systems. Disc levels:  No specific abnormality Upper chest: Lung apices clear Other: N/A IMPRESSION: Atrophy with small vessel chronic ischemic changes of deep cerebral white matter. No acute intracranial abnormalities. 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 No acute cervical spine abnormalities. Electronically Signed   By: Ulyses SouthwardMark  Boles M.D.   On: 01/16/2021 15:35   CT Cervical Spine Wo Contrast  Result Date: 01/15/2021 CLINICAL DATA:  Fall from standing with eye laceration EXAM: CT HEAD WITHOUT CONTRAST CT CERVICAL SPINE WITHOUT CONTRAST TECHNIQUE: Multidetector CT imaging of the head and cervical spine was performed following the standard protocol without intravenous contrast. Multiplanar CT image reconstructions of the cervical spine were also generated. COMPARISON:  None. FINDINGS: CT HEAD FINDINGS Brain: No evidence of swelling, acute infarction, hemorrhage, hydrocephalus, extra-axial collection or mass lesion/mass effect. Chronic small vessel ischemia which is confluent in the deep white matter. 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 Vascular: No hyperdense vessel or unexpected calcification. Skull: No acute finding. Benign sclerosis at the sphenoid body from fibro-osseous lesion or arrested sinus aeration. Sinuses/Orbits: No evidence of injury. Bilateral cataract resection. CT CERVICAL SPINE FINDINGS Alignment: Normal. Skull base and vertebrae: No acute fracture. No primary bone lesion or focal pathologic process. Soft tissues and spinal canal: No prevertebral fluid or swelling. No visible canal hematoma. Disc levels: Multilevel  degenerative disc narrowing and facet spurring. Upper chest: Negative IMPRESSION: 1. No evidence of acute intracranial or cervical spine injury. 2. Small remote right parietal infarct. Electronically Signed   By: Marnee SpringJonathon  Watts M.D.   On: 01/15/2021 07:21    Microbiology: Recent Results (from the past 240 hour(s))  Blood culture (routine single)     Status: Abnormal   Collection Time: 02/01/2021  2:09 PM   Specimen: BLOOD  Result Value Ref Range Status   Specimen Description   Final  BLOOD BLOOD LEFT FOREARM Performed at [facility], [address], [place], [place] [address]    Special Requests   Final    BOTTLES DRAWN AEROBIC AND ANAEROBIC Blood Culture adequate volume Performed at [facility], [address] [place], [place] [address]    Culture  Setup Time   Final    GRAM POSITIVE COCCI IN CLUSTERS IN BOTH AEROBIC AND ANAEROBIC BOTTLES CRITICAL RESULT CALLED TO, READ BACK BY AND VERIFIED WITH: SCOTT CHRISTY PHARMD  02/03/21 EB    Culture (A)  Final    STAPHYLOCOCCUS HAEMOLYTICUS THE SIGNIFICANCE OF ISOLATING THIS ORGANISM FROM A SINGLE VENIPUNCTURE CANNOT BE PREDICTED WITHOUT FURTHER CLINICAL AND CULTURE CORRELATION. SUSCEPTIBILITIES AVAILABLE ONLY ON REQUEST. Performed at [facility], [address], [place], [place] [address]    Report Status 02/05/2021 FINAL  Final  Blood Culture ID Panel (Reflexed)     Status: Abnormal   Collection Time: 01/26/2021  2:09 PM  Result Value Ref Range Status   Enterococcus faecalis NOT DETECTED NOT DETECTED Final   Enterococcus Faecium NOT DETECTED NOT DETECTED Final   Listeria monocytogenes NOT DETECTED NOT DETECTED Final   Staphylococcus species DETECTED (A) NOT DETECTED Final    Comment: CRITICAL RESULT CALLED TO, READ BACK BY AND VERIFIED WITH: SCOTT CHRISTY PHARMD  02/03/21 EB    Staphylococcus aureus (BCID) NOT DETECTED NOT DETECTED Final   Staphylococcus epidermidis NOT DETECTED  NOT DETECTED Final   Staphylococcus lugdunensis NOT DETECTED NOT DETECTED Final   Streptococcus species NOT DETECTED NOT DETECTED Final   Streptococcus agalactiae NOT DETECTED NOT DETECTED Final   Streptococcus pneumoniae NOT DETECTED NOT DETECTED Final   Streptococcus pyogenes NOT DETECTED NOT DETECTED Final   A.calcoaceticus-baumannii NOT DETECTED NOT DETECTED Final   Bacteroides fragilis NOT DETECTED NOT DETECTED Final   Enterobacterales NOT DETECTED NOT DETECTED Final   Enterobacter cloacae complex NOT DETECTED NOT DETECTED Final   Escherichia coli NOT DETECTED NOT DETECTED Final   Klebsiella aerogenes NOT DETECTED NOT DETECTED Final   Klebsiella oxytoca NOT DETECTED NOT DETECTED Final   Klebsiella pneumoniae NOT DETECTED NOT DETECTED Final   Proteus species NOT DETECTED NOT DETECTED Final   Salmonella species NOT DETECTED NOT DETECTED Final   Serratia marcescens NOT DETECTED NOT DETECTED Final   Haemophilus influenzae NOT DETECTED NOT DETECTED Final   Neisseria meningitidis NOT DETECTED NOT DETECTED Final   Pseudomonas aeruginosa NOT DETECTED NOT DETECTED Final   Stenotrophomonas maltophilia NOT DETECTED NOT DETECTED Final   Candida albicans NOT DETECTED NOT DETECTED Final   Candida auris NOT DETECTED NOT DETECTED Final   Candida glabrata NOT DETECTED NOT DETECTED Final   Candida krusei NOT DETECTED NOT DETECTED Final   Candida parapsilosis NOT DETECTED NOT DETECTED Final   Candida tropicalis NOT DETECTED NOT DETECTED Final   Cryptococcus neoformans/gattii NOT DETECTED NOT DETECTED Final    Comment: Performed at [facility], [address] [place], [place] [address]  Urine culture     Status: Abnormal   Collection Time: 01/28/2021  3:26 PM   Specimen: Urine, Catheterized  Result Value Ref Range Status   Specimen Description   Final    URINE, CATHETERIZED Performed at [facility], [address], [place], [place] [address]    Special Requests   Final     NONE Performed at [facility], [address] [place], [place] [address]    Culture (A)  Final    <10,000  COLONIES/mL INSIGNIFICANT GROWTH Performed at [facility], [address] [place], [place] [address]    Report Status 02/04/2021 FINAL  Final  SARS CORONAVIRUS 2 (TAT 6-24 HRS) Nasopharyngeal Nasopharyngeal Swab     Status: None   Collection Time: Feb 23, 2021  8:27 PM   Specimen: Nasopharyngeal Swab  Result Value Ref Range Status   SARS Coronavirus 2 NEGATIVE NEGATIVE Final    Comment: (NOTE) SARS-CoV-2 target nucleic acids are NOT DETECTED.  The SARS-CoV-2 RNA is generally detectable in upper and lower respiratory specimens during the acute phase of infection. Negative results do not preclude SARS-CoV-2 infection, do not rule out co-infections with other pathogens, and should not be used as the sole basis for treatment or other patient management decisions. Negative results must be combined with clinical observations, patient history, and epidemiological information. The expected result is Negative.  Fact Sheet for Patients: HairSlick.no  Fact Sheet for Healthcare Providers: quierodirigir.com  This test is not yet approved or cleared by the [place] FDA and  has been authorized for detection and/or diagnosis of SARS-CoV-2 by FDA under an Emergency Use Authorization (EUA). This EUA will remain  in effect (meaning this test can be used) for the duration of the COVID-19 declaration under Se ction 564(b)(1) of the Act, 21 U.S.C. section 360bbb-3(b)(1), unless the authorization is terminated or revoked sooner.  Performed at [facility], [address], [place], [place] [address]      Labs: Basic Metabolic Panel: Recent Labs  Lab 23-Feb-2021 1409  NA 165*  K 3.8  CL 126*  CO2 30  GLUCOSE 118*  BUN 47*  CREATININE 1.23  CALCIUM 10.6*   Liver Function Tests: Recent Labs   Lab 02/23/2021 1409  AST 29  ALT 20  ALKPHOS 124  BILITOT 1.4*  PROT 6.9  ALBUMIN 3.7   No results for input(s): LIPASE, AMYLASE in the last 168 hours. No results for input(s): AMMONIA in the last 168 hours. CBC: Recent Labs  Lab 02/23/2021 1409  WBC 5.4  NEUTROABS 2.5  HGB 12.2*  HCT 39.8  MCV 106.1*  PLT 211   Cardiac Enzymes: No results for input(s): CKTOTAL, CKMB, CKMBINDEX, TROPONINI in the last 168 hours. D-Dimer No results for input(s): DDIMER in the last 72 hours. BNP: Invalid input(s): POCBNP CBG: No results for input(s): GLUCAP in the last 168 hours. Anemia work up No results for input(s): VITAMINB12, FOLATE, FERRITIN, TIBC, IRON, RETICCTPCT in the last 72 hours. Urinalysis    Component Value Date/Time   COLORURINE YELLOW 02-23-2021 1526   APPEARANCEUR TURBID (A) 23-Feb-2021 1526   LABSPEC 1.021 2021/02/23 1526   PHURINE 5.0 02/23/2021 1526   GLUCOSEU NEGATIVE February 23, 2021 1526   HGBUR SMALL (A) 23-Feb-2021 1526   BILIRUBINUR NEGATIVE 02/23/2021 1526   KETONESUR NEGATIVE February 23, 2021 1526   PROTEINUR 100 (A) February 23, 2021 1526   NITRITE NEGATIVE 2021/02/23 1526   LEUKOCYTESUR MODERATE (A) 02-23-2021 1526   Sepsis Labs Invalid input(s): PROCALCITONIN,  WBC,  LACTICIDVEN     SIGNED:  Coralie Keens, MD  [facility] 01/28/2021, 3:49 PM Pager   If 7PM-7AM, please contact night-coverage [email or web address] Password [email or web address]

## 2021-02-12 NOTE — Progress Notes (Signed)
Palliative care progress note                                                                                                                                                                                                           Daily Progress Note   Patient Name: Johnathan Combs       Date: 02/05/2021 DOB: November 11, 1941  Age: 80 y.o. MRN#: [id number] Attending Physician: No att. providers found Primary Care Physician: Patient, No Pcp Per Admit Date: 2021-02-09  Reason for Consultation/Follow-up: Terminal Care  Subjective: I saw and examined Johnathan Combs today.  He is on morphine infusion and is resting comfortably with no distress noted.  Did not respond to gentle verbal or tactile stimulation.  Length of Stay: 4  Physical Exam   General: Somnolent, no distress, periods of apnea noted HEENT: No bruits, no goiter, no JVD Heart: Tachycardic. No murmur appreciated. Lungs: Decreased air movement, rhonchi  abdomen: Soft, nontender, nondistended, positive bowel sounds.  Ext: No significant edema Skin: Warm and dry        Vital Signs: BP (!) 93/58 (BP Location: Left Arm)   Pulse (!) 109   Temp 99.6 F (37.6 C) (Oral)   Resp 14   Wt 59.4 kg   SpO2 (!) 78%  SpO2: SpO2: (!) 78 % O2 Device: O2 Device: Room Air O2 Flow Rate:    Intake/output summary:   Intake/Output Summary (Last 24 hours) at 01/17/2021 2219 Last data filed at 02/11/2021 0546 Gross per 24 hour  Intake --  Output 50 ml  Net -50 ml   LBM: Last BM Date:  (PTA) Baseline Weight: Weight: 59.4 kg Most recent weight: Weight: 59.4 kg       Palliative Assessment/Data:      Patient Active Problem List   Diagnosis Date Noted  . Hypernatremia 02-09-2021  . Dehydration 02/09/2021  . Hypercalcemia 02/09/21  . Hypertension   . Dementia with aggressive behavior (HCC)   . CAD (coronary artery disease)     Palliative Care Assessment & Plan   Patient Profile: 80 year old male with significant dementia, CAD,  hypertension who presented to ED after being found down at SNF.  Goal moving forward is full comfort.  Recommendations/Plan: - DNR/DNI.   - Continue comfort measures - Anticipate hospital death. - I called his son yesterday but was unable to reach him today.  I left a voicemail with my callback number if he has any questions or concerns.  Goals of Care and Additional Recommendations: Limitations on Scope of Treatment: Full Comfort Care  Code Status:    Code Status Orders  (From admission, onward)         Start     Ordered   06-Feb-2021 2204  Do not attempt resuscitation (DNR)  Continuous       Question Answer Comment  In the event of cardiac or respiratory ARREST Do not call a "code blue"   In the event of cardiac or respiratory ARREST Do not perform Intubation, CPR, defibrillation or ACLS   In the event of cardiac or respiratory ARREST Use medication by any route, position, wound care, and other measures to relive pain and suffering. May use oxygen, suction and manual treatment of airway obstruction as needed for comfort.      2021-02-06 2205        Code Status History    This patient has a current code status but no historical code status.   Advance Care Planning Activity      Prognosis:  Hours - Days  Discharge Planning: Anticipated Hospital Death  Care plan was discussed with bedside RN  Thank you for allowing the Palliative Medicine Team to assist in the care of this patient.   Total Time 20 Prolonged Time Billed No   Greater than 50%  of this time was spent counseling and coordinating care related to the above assessment and plan.  Romie Minus, MD  Please contact Palliative Medicine Team phone at [phone] for questions and concerns.

## 2021-02-12 DEATH — deceased

## 2021-08-01 IMAGING — CT CT HEAD W/O CM
2 series · 15 of 37 positions shown, 18 images · non-contrast
Comparison: Two in 22

CLINICAL DATA: Found on floor at [HOSPITAL], neck trauma, head
trauma

EXAM:
CT HEAD WITHOUT CONTRAST
CT CERVICAL SPINE WITHOUT CONTRAST
TECHNIQUE: Multidetector CT imaging of the head and cervical spine was
performed following the standard protocol without intravenous
contrast. Multiplanar CT image reconstructions of the cervical spine
were also generated.

[Series 3: head wo · axial · 0.47mm/px · z∈[+1580,+1715]mm · 12 of 33 slices shown, 15 images]
[im 3/33  brain]
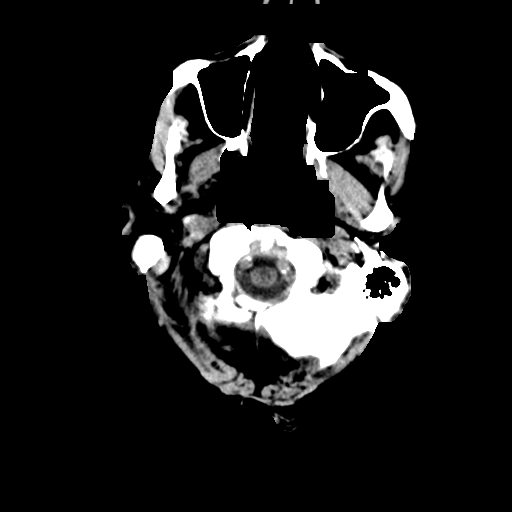
[im 3/33  bone]
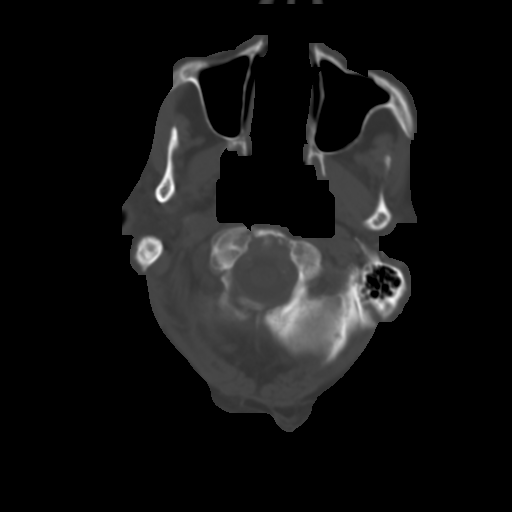
[im 5/33  brain]
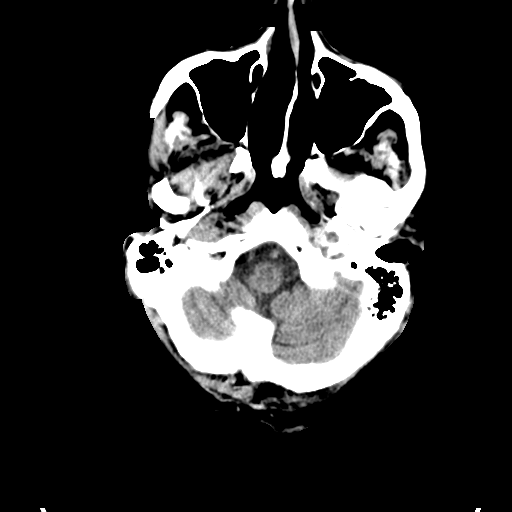
[im 7/33  brain]
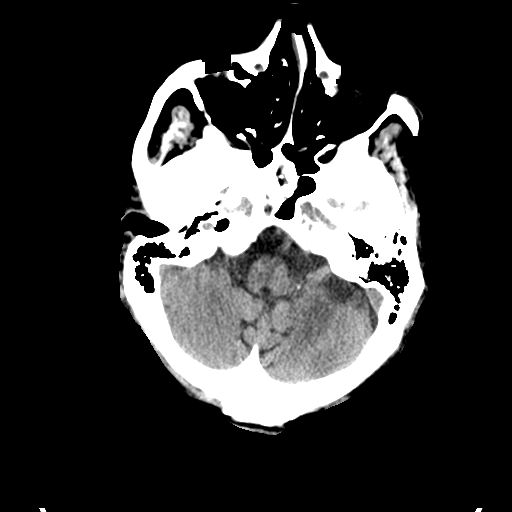
[im 10/33  brain]
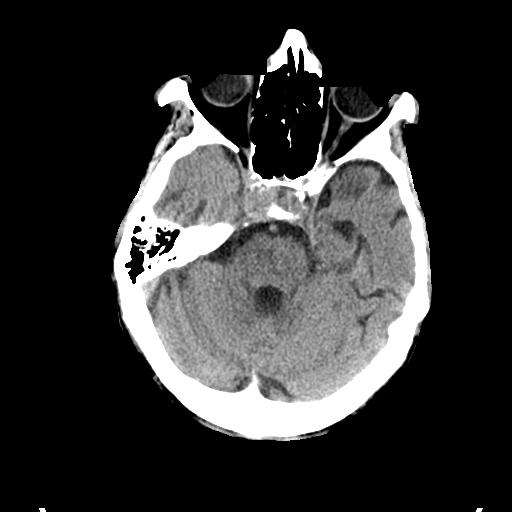
[im 13/33  brain]
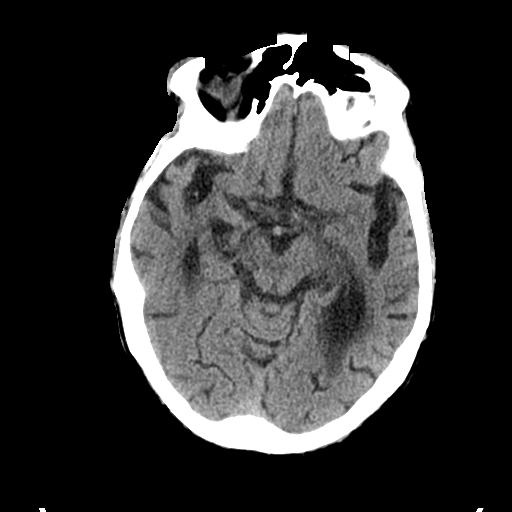
[im 13/33  bone]
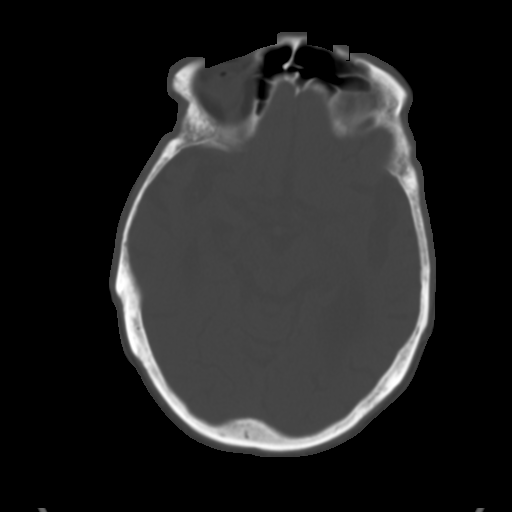
[im 15/33  brain]
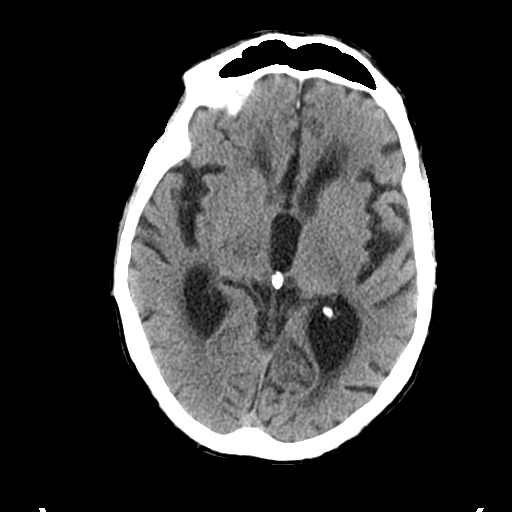
[im 18/33  brain]
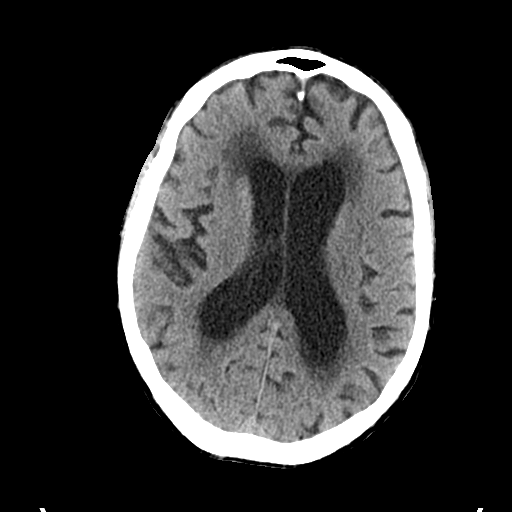
[im 20/33  brain]
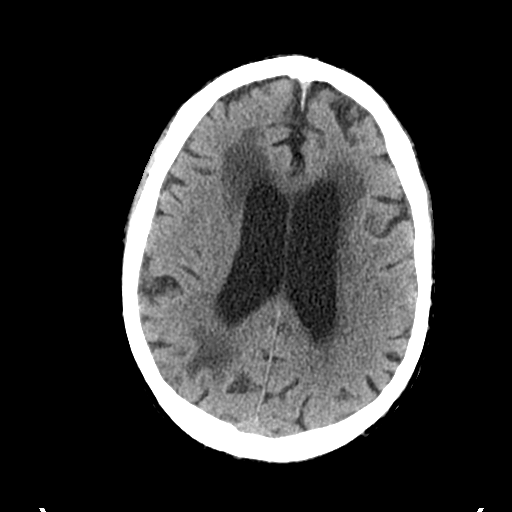
[im 23/33  brain]
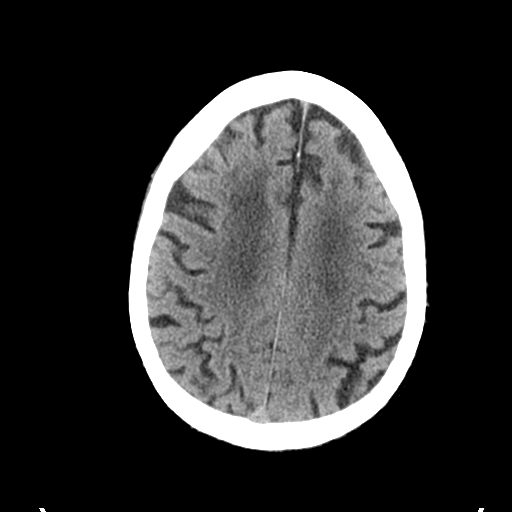
[im 23/33  bone]
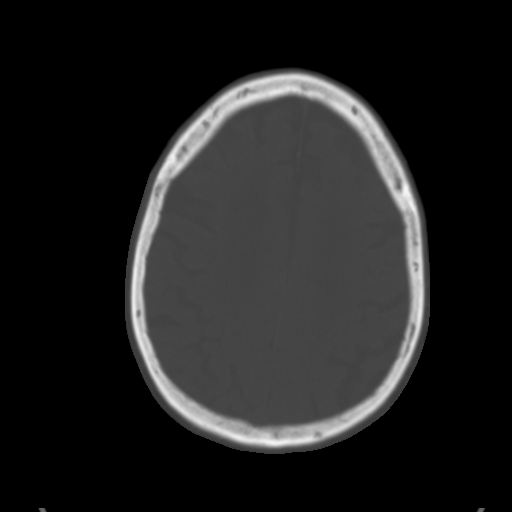
[im 26/33  brain]
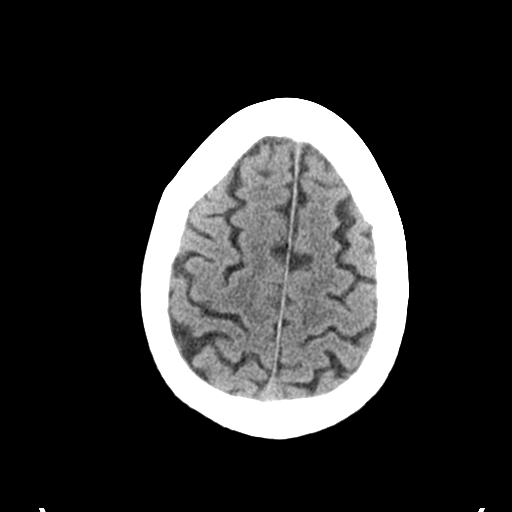
[im 28/33  brain]
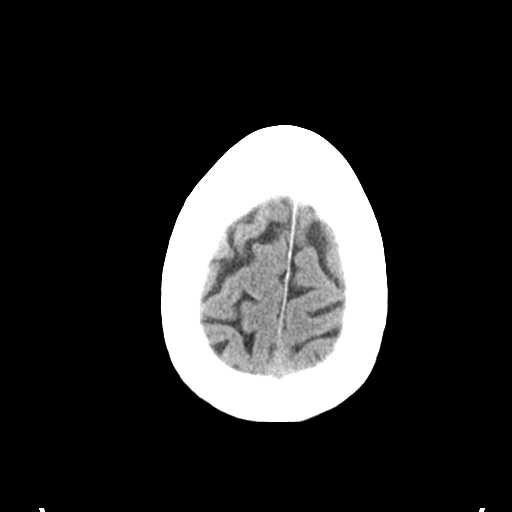
[im 30/33  brain]
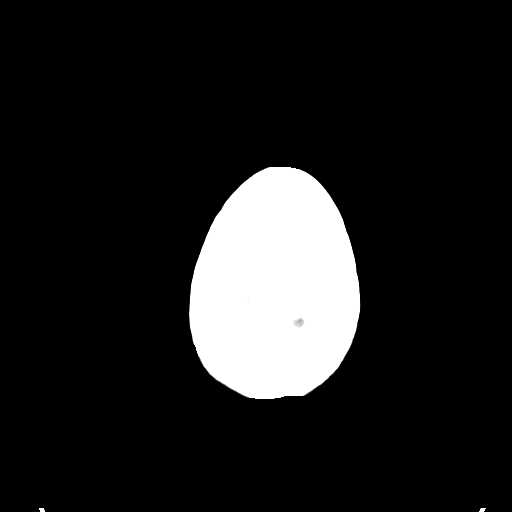

[Series 7: sagittal soft tissue · sagittal · 0.32mm/px · 3 of 57 slices shown]
[im 19/57  brain]
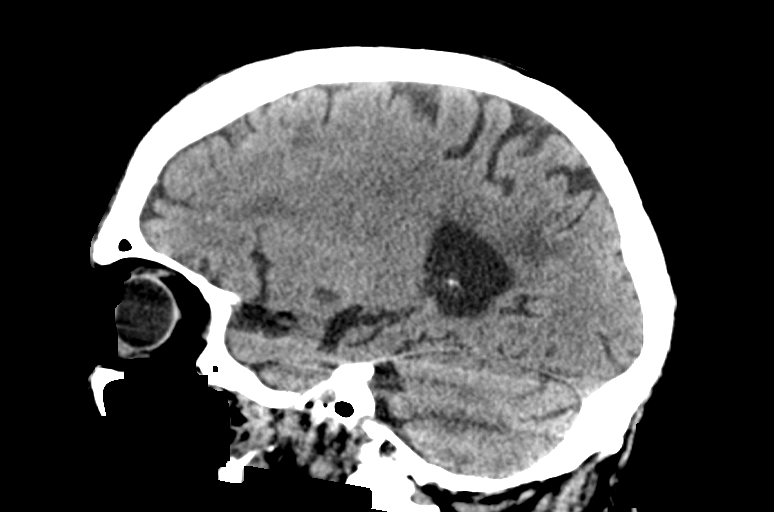
[im 29/57  brain]
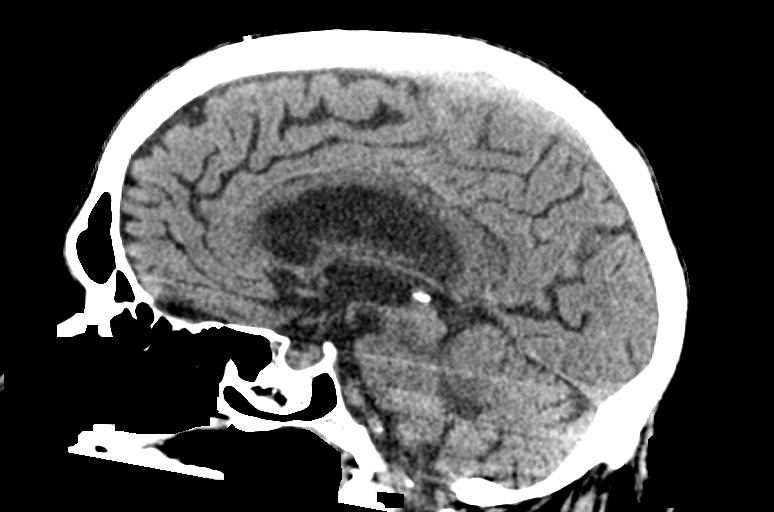
[im 38/57  brain]
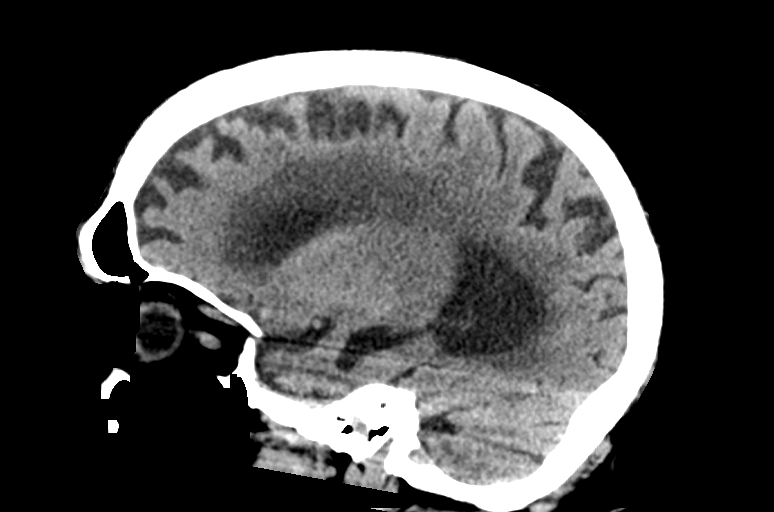

[15 of 37 positions shown; findings below may reference images not displayed]

FINDINGS: CT HEAD FINDINGS

Brain: Generalized atrophy. Normal ventricular morphology. No
midline shift or mass effect. Small vessel chronic ischemic changes
of deep cerebral white matter. No intracranial hemorrhage, mass
lesion, evidence of acute infarction, or extra-axial fluid
collection.

Vascular: No hyperdense vessels. Atherosclerotic calcification at
vertebral arteries.

Skull: Demineralized but intact

Sinuses/Orbits: Clear

Other: N/A

CT CERVICAL SPINE FINDINGS

Alignment: Minimal anterolisthesis C4-C5. Remaining alignments
normal.

Skull base and vertebrae: Multilevel facet degenerative changes.
Multilevel disc space narrowing with few scattered endplate spurs.
Vertebral body heights maintained. Skull base intact. No fracture,
subluxation, or bone destruction.

Soft tissues and spinal canal: Prevertebral soft tissues normal
thickness. Significant atherosclerotic calcification in the carotid
systems.

Disc levels:  No specific abnormality

Upper chest: Lung apices clear

Other: N/A
IMPRESSION: Atrophy with small vessel chronic ischemic changes of deep cerebral
white matter.

No acute intracranial abnormalities.

Multilevel degenerative disc and facet disease changes of the
cervical spine.

No acute cervical spine abnormalities.

## 2021-08-01 IMAGING — CT CT CERVICAL SPINE W/O CM
3 of 4 series · 11 of 33 positions shown, 13 images · non-contrast
Comparison: Two in 22

CLINICAL DATA: Found on floor at [HOSPITAL], neck trauma, head
trauma

EXAM:
CT HEAD WITHOUT CONTRAST
CT CERVICAL SPINE WITHOUT CONTRAST
TECHNIQUE: Multidetector CT imaging of the head and cervical spine was
performed following the standard protocol without intravenous
contrast. Multiplanar CT image reconstructions of the cervical spine
were also generated.

[Series 6: orthogonal bone · axial · 0.23mm/px · z∈[+1396,+1547]mm · 3 of 124 slices shown, 4 images]
[im 21/124  soft-tissue]
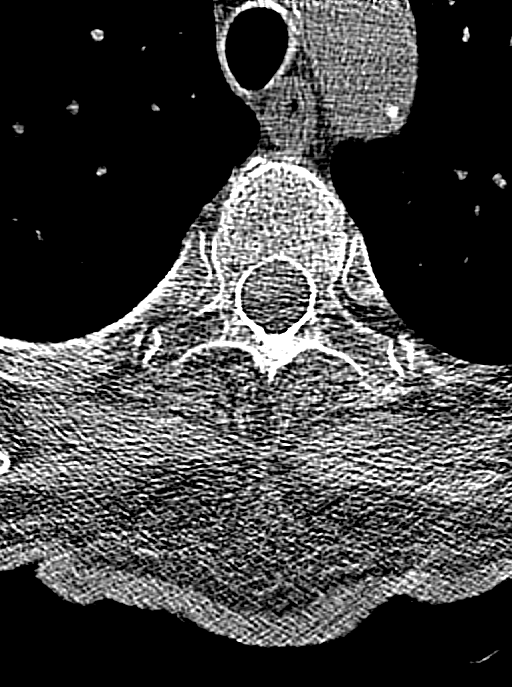
[im 21/124  bone]
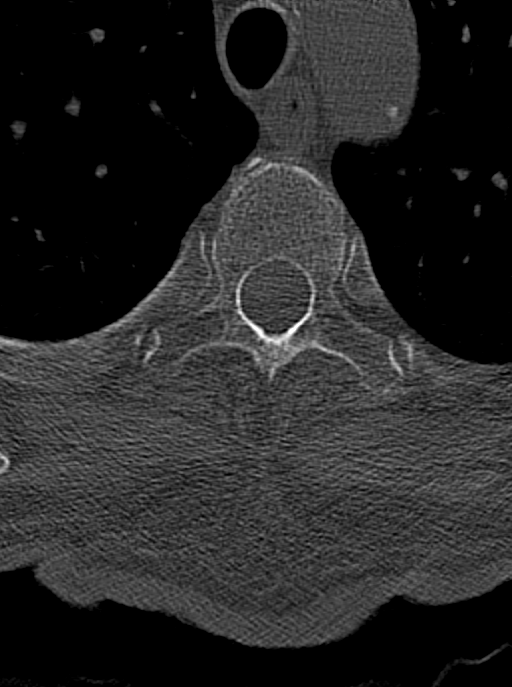
[im 62/124  bone]
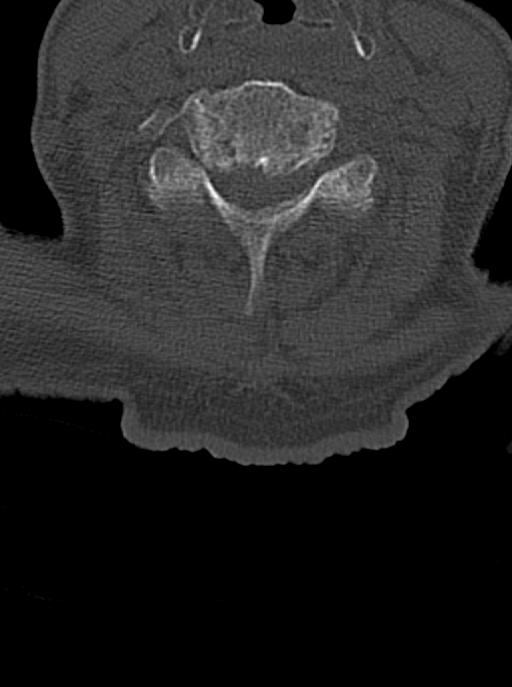
[im 103/124  bone]
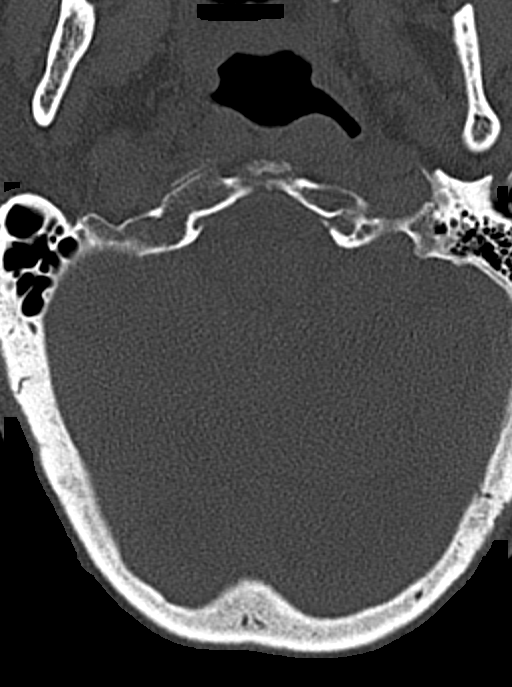

[Series 7: coronal bone · coronal · 0.32mm/px · 3 of 66 slices shown]
[im 17/66  bone]
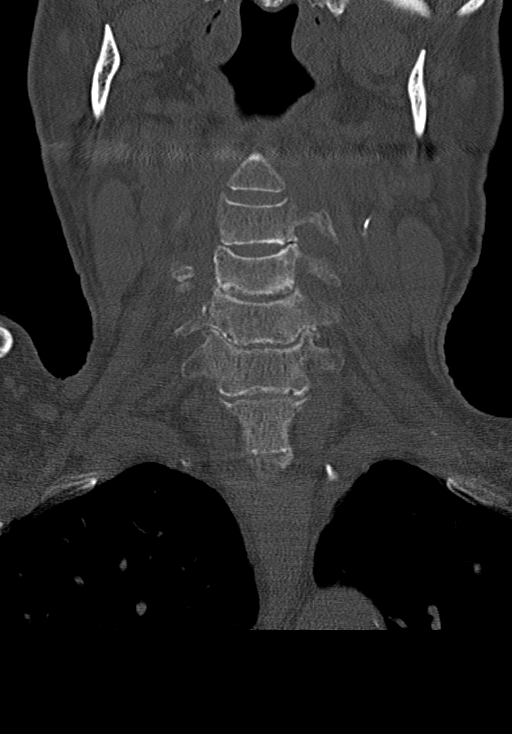
[im 28/66  bone]
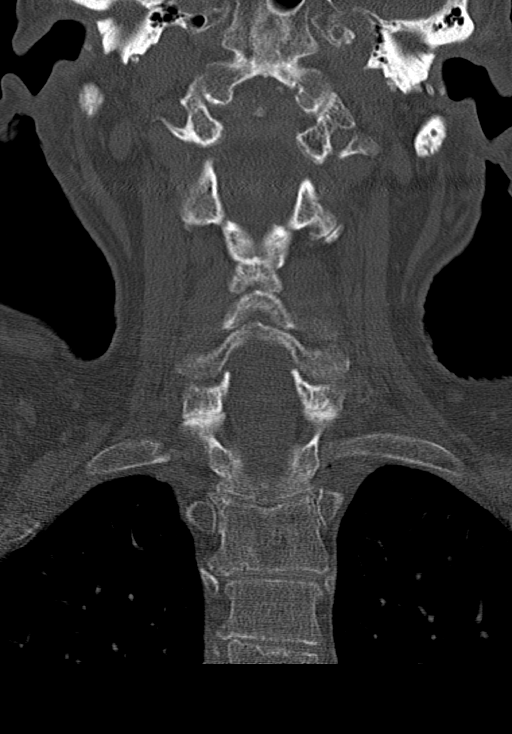
[im 39/66  bone]
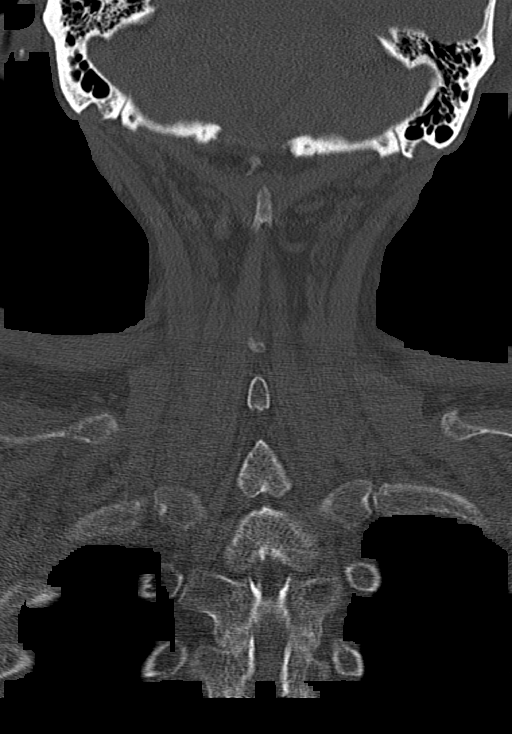

[Series 8: sagittal bone · sagittal · 0.32mm/px · 5 of 61 slices shown, 6 images]
[im 21/61  bone]
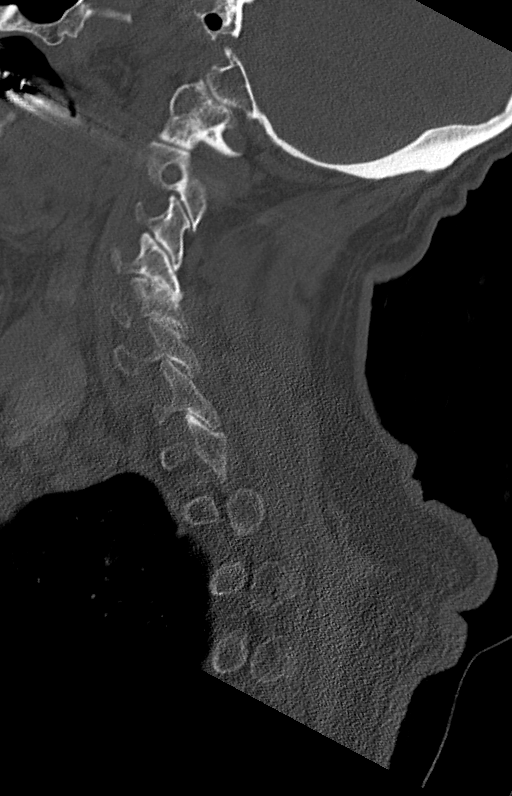
[im 26/61  bone]
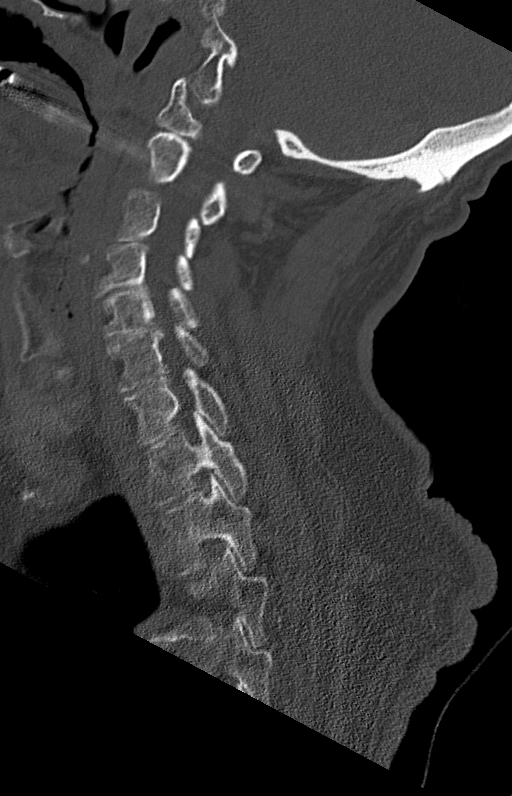
[im 31/61  soft-tissue]
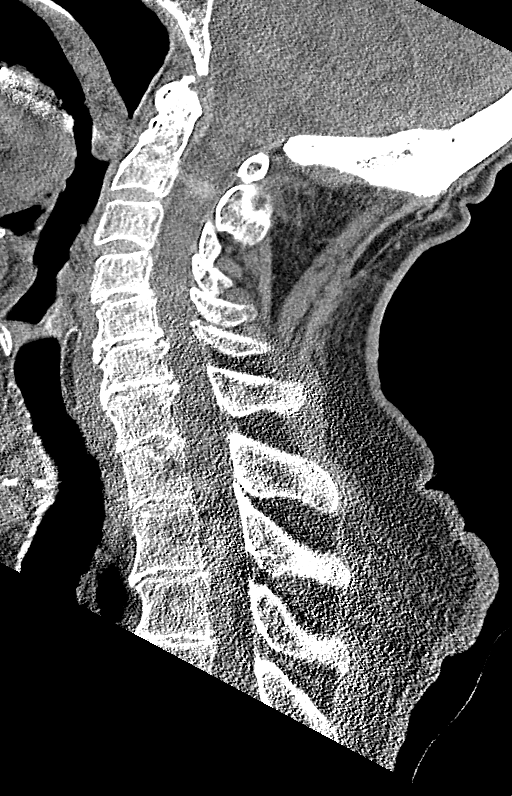
[im 31/61  bone]
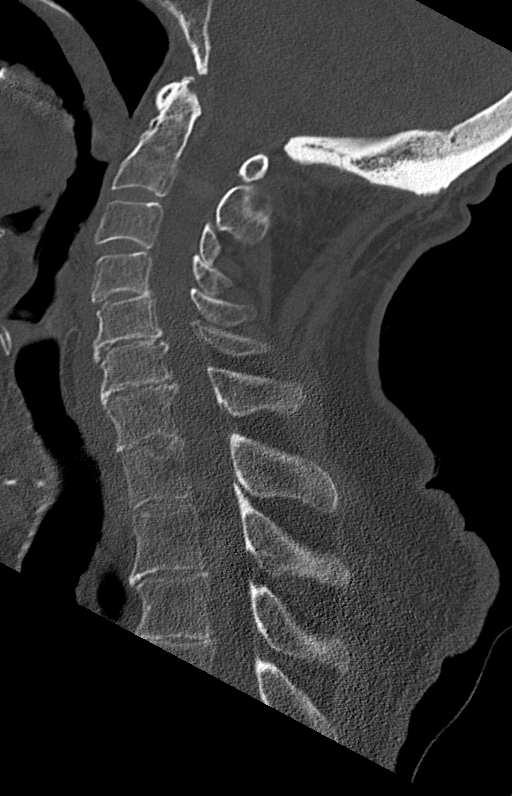
[im 36/61  bone]
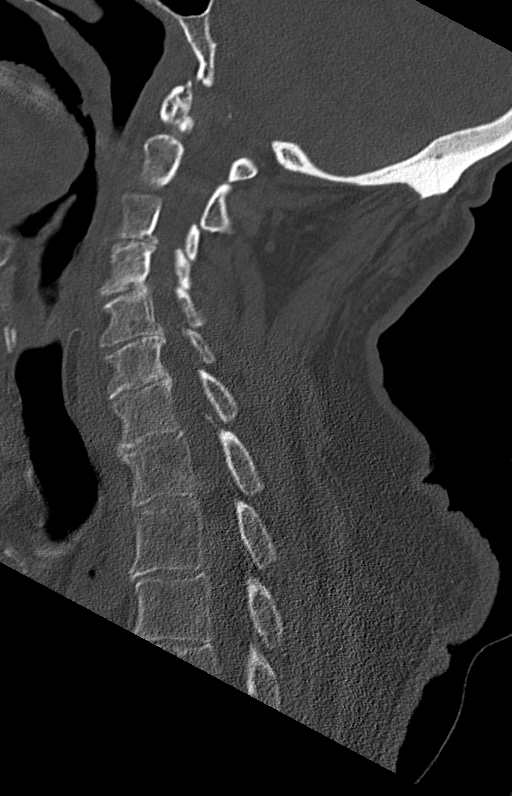
[im 41/61  bone]
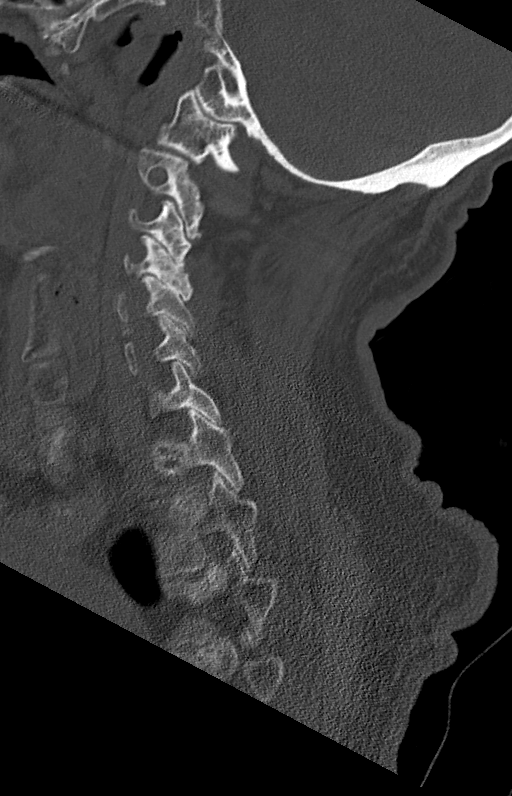

[11 of 33 positions shown; findings below may reference images not displayed]

FINDINGS: CT HEAD FINDINGS

Brain: Generalized atrophy. Normal ventricular morphology. No
midline shift or mass effect. Small vessel chronic ischemic changes
of deep cerebral white matter. No intracranial hemorrhage, mass
lesion, evidence of acute infarction, or extra-axial fluid
collection.

Vascular: No hyperdense vessels. Atherosclerotic calcification at
vertebral arteries.

Skull: Demineralized but intact

Sinuses/Orbits: Clear

Other: N/A

CT CERVICAL SPINE FINDINGS

Alignment: Minimal anterolisthesis C4-C5. Remaining alignments
normal.

Skull base and vertebrae: Multilevel facet degenerative changes.
Multilevel disc space narrowing with few scattered endplate spurs.
Vertebral body heights maintained. Skull base intact. No fracture,
subluxation, or bone destruction.

Soft tissues and spinal canal: Prevertebral soft tissues normal
thickness. Significant atherosclerotic calcification in the carotid
systems.

Disc levels:  No specific abnormality

Upper chest: Lung apices clear

Other: N/A
IMPRESSION: Atrophy with small vessel chronic ischemic changes of deep cerebral
white matter.

No acute intracranial abnormalities.

Multilevel degenerative disc and facet disease changes of the
cervical spine.

No acute cervical spine abnormalities.
